# Patient Record
Sex: Female | Born: 1935 | Race: White | Hispanic: No | Smoking: Former smoker
Health system: Southern US, Community
[De-identification: ages and names within clinical notes are randomized; demographics above are authoritative.]

## PROBLEM LIST (undated history)

## (undated) DIAGNOSIS — S52602C Unspecified fracture of lower end of left ulna, initial encounter for open fracture type IIIA, IIIB, or IIIC: Secondary | ICD-10-CM

## (undated) DIAGNOSIS — F419 Anxiety disorder, unspecified: Secondary | ICD-10-CM

## (undated) DIAGNOSIS — S72002A Fracture of unspecified part of neck of left femur, initial encounter for closed fracture: Secondary | ICD-10-CM

## (undated) DIAGNOSIS — E039 Hypothyroidism, unspecified: Secondary | ICD-10-CM

## (undated) DIAGNOSIS — M1990 Unspecified osteoarthritis, unspecified site: Secondary | ICD-10-CM

## (undated) DIAGNOSIS — I10 Essential (primary) hypertension: Secondary | ICD-10-CM

## (undated) DIAGNOSIS — S52502C Unspecified fracture of the lower end of left radius, initial encounter for open fracture type IIIA, IIIB, or IIIC: Secondary | ICD-10-CM

## (undated) DIAGNOSIS — R413 Other amnesia: Secondary | ICD-10-CM

## (undated) DIAGNOSIS — K219 Gastro-esophageal reflux disease without esophagitis: Secondary | ICD-10-CM

## (undated) DIAGNOSIS — C801 Malignant (primary) neoplasm, unspecified: Secondary | ICD-10-CM

## (undated) DIAGNOSIS — E079 Disorder of thyroid, unspecified: Secondary | ICD-10-CM

## (undated) DIAGNOSIS — R569 Unspecified convulsions: Secondary | ICD-10-CM

## (undated) HISTORY — PX: EYE SURGERY: SHX253

## (undated) HISTORY — PX: COLONOSCOPY: SHX174

---

## 1998-09-17 ENCOUNTER — Other Ambulatory Visit: Admission: RE | Admit: 1998-09-17 | Discharge: 1998-09-17 | Payer: Self-pay | Admitting: Family Medicine

## 2004-09-23 ENCOUNTER — Ambulatory Visit: Payer: Self-pay | Admitting: Family Medicine

## 2004-10-06 ENCOUNTER — Ambulatory Visit: Payer: Self-pay | Admitting: Family Medicine

## 2005-01-13 ENCOUNTER — Ambulatory Visit: Payer: Self-pay | Admitting: Family Medicine

## 2005-02-24 ENCOUNTER — Ambulatory Visit: Payer: Self-pay | Admitting: Family Medicine

## 2005-03-31 ENCOUNTER — Ambulatory Visit: Payer: Self-pay | Admitting: Family Medicine

## 2005-08-13 ENCOUNTER — Ambulatory Visit: Payer: Self-pay | Admitting: Family Medicine

## 2005-08-19 ENCOUNTER — Ambulatory Visit: Payer: Self-pay | Admitting: Family Medicine

## 2005-12-15 ENCOUNTER — Ambulatory Visit: Payer: Self-pay | Admitting: Family Medicine

## 2005-12-23 ENCOUNTER — Ambulatory Visit: Payer: Self-pay | Admitting: Family Medicine

## 2018-02-07 DIAGNOSIS — R569 Unspecified convulsions: Secondary | ICD-10-CM

## 2018-02-07 HISTORY — DX: Unspecified convulsions: R56.9

## 2018-02-23 ENCOUNTER — Inpatient Hospital Stay (HOSPITAL_COMMUNITY)
Admission: EM | Admit: 2018-02-23 | Discharge: 2018-02-28 | DRG: 956 | Disposition: A | Discharge status: Skilled Nursing Facility | Payer: Medicare Other | Attending: Family Medicine | Admitting: Family Medicine

## 2018-02-23 ENCOUNTER — Inpatient Hospital Stay (HOSPITAL_COMMUNITY): Payer: Medicare Other

## 2018-02-23 ENCOUNTER — Emergency Department (HOSPITAL_COMMUNITY): Payer: Medicare Other

## 2018-02-23 ENCOUNTER — Encounter (HOSPITAL_COMMUNITY): Payer: Self-pay | Admitting: Neurology

## 2018-02-23 ENCOUNTER — Emergency Department (HOSPITAL_COMMUNITY): Payer: Medicare Other | Admitting: Anesthesiology

## 2018-02-23 ENCOUNTER — Encounter (HOSPITAL_COMMUNITY)
Admission: EM | Disposition: A | Discharge status: Skilled Nursing Facility | Payer: Self-pay | Source: Home / Self Care | Attending: Family Medicine

## 2018-02-23 DIAGNOSIS — S41012A Laceration without foreign body of left shoulder, initial encounter: Secondary | ICD-10-CM | POA: Diagnosis present

## 2018-02-23 DIAGNOSIS — W19XXXA Unspecified fall, initial encounter: Secondary | ICD-10-CM

## 2018-02-23 DIAGNOSIS — I10 Essential (primary) hypertension: Secondary | ICD-10-CM | POA: Diagnosis present

## 2018-02-23 DIAGNOSIS — R569 Unspecified convulsions: Secondary | ICD-10-CM | POA: Diagnosis present

## 2018-02-23 DIAGNOSIS — I452 Bifascicular block: Secondary | ICD-10-CM | POA: Diagnosis present

## 2018-02-23 DIAGNOSIS — I6782 Cerebral ischemia: Secondary | ICD-10-CM | POA: Diagnosis present

## 2018-02-23 DIAGNOSIS — S0083XA Contusion of other part of head, initial encounter: Secondary | ICD-10-CM | POA: Diagnosis present

## 2018-02-23 DIAGNOSIS — J9811 Atelectasis: Secondary | ICD-10-CM | POA: Diagnosis not present

## 2018-02-23 DIAGNOSIS — S0003XA Contusion of scalp, initial encounter: Secondary | ICD-10-CM | POA: Diagnosis present

## 2018-02-23 DIAGNOSIS — G3189 Other specified degenerative diseases of nervous system: Secondary | ICD-10-CM | POA: Diagnosis present

## 2018-02-23 DIAGNOSIS — S72002A Fracture of unspecified part of neck of left femur, initial encounter for closed fracture: Principal | ICD-10-CM

## 2018-02-23 DIAGNOSIS — S52602C Unspecified fracture of lower end of left ulna, initial encounter for open fracture type IIIA, IIIB, or IIIC: Secondary | ICD-10-CM | POA: Diagnosis present

## 2018-02-23 DIAGNOSIS — S52602F Unspecified fracture of lower end of left ulna, subsequent encounter for open fracture type IIIA, IIIB, or IIIC with routine healing: Secondary | ICD-10-CM

## 2018-02-23 DIAGNOSIS — E039 Hypothyroidism, unspecified: Secondary | ICD-10-CM | POA: Diagnosis present

## 2018-02-23 DIAGNOSIS — M25552 Pain in left hip: Secondary | ICD-10-CM

## 2018-02-23 DIAGNOSIS — F0390 Unspecified dementia without behavioral disturbance: Secondary | ICD-10-CM | POA: Diagnosis present

## 2018-02-23 DIAGNOSIS — W010XXA Fall on same level from slipping, tripping and stumbling without subsequent striking against object, initial encounter: Secondary | ICD-10-CM | POA: Diagnosis present

## 2018-02-23 DIAGNOSIS — D62 Acute posthemorrhagic anemia: Secondary | ICD-10-CM | POA: Diagnosis not present

## 2018-02-23 DIAGNOSIS — Z23 Encounter for immunization: Secondary | ICD-10-CM

## 2018-02-23 DIAGNOSIS — S62102B Fracture of unspecified carpal bone, left wrist, initial encounter for open fracture: Secondary | ICD-10-CM

## 2018-02-23 DIAGNOSIS — S52502C Unspecified fracture of the lower end of left radius, initial encounter for open fracture type IIIA, IIIB, or IIIC: Secondary | ICD-10-CM | POA: Diagnosis present

## 2018-02-23 DIAGNOSIS — E876 Hypokalemia: Secondary | ICD-10-CM | POA: Diagnosis present

## 2018-02-23 DIAGNOSIS — Z9889 Other specified postprocedural states: Secondary | ICD-10-CM

## 2018-02-23 DIAGNOSIS — S52502F Unspecified fracture of the lower end of left radius, subsequent encounter for open fracture type IIIA, IIIB, or IIIC with routine healing: Secondary | ICD-10-CM | POA: Diagnosis present

## 2018-02-23 DIAGNOSIS — Z79899 Other long term (current) drug therapy: Secondary | ICD-10-CM | POA: Diagnosis not present

## 2018-02-23 DIAGNOSIS — S7290XA Unspecified fracture of unspecified femur, initial encounter for closed fracture: Secondary | ICD-10-CM | POA: Diagnosis present

## 2018-02-23 DIAGNOSIS — K219 Gastro-esophageal reflux disease without esophagitis: Secondary | ICD-10-CM | POA: Diagnosis present

## 2018-02-23 DIAGNOSIS — D696 Thrombocytopenia, unspecified: Secondary | ICD-10-CM | POA: Diagnosis present

## 2018-02-23 DIAGNOSIS — Z8781 Personal history of (healed) traumatic fracture: Secondary | ICD-10-CM

## 2018-02-23 HISTORY — DX: Disorder of thyroid, unspecified: E07.9

## 2018-02-23 HISTORY — DX: Fracture of unspecified part of neck of left femur, initial encounter for closed fracture: S72.002A

## 2018-02-23 HISTORY — DX: Unspecified fracture of the lower end of left radius, initial encounter for open fracture type IIIA, IIIB, or IIIC: S52.502C

## 2018-02-23 HISTORY — DX: Unspecified fracture of lower end of left ulna, initial encounter for open fracture type IIIA, IIIB, or IIIC: S52.602C

## 2018-02-23 HISTORY — DX: Gastro-esophageal reflux disease without esophagitis: K21.9

## 2018-02-23 HISTORY — PX: EXTERNAL FIXATION ARM: SHX1552

## 2018-02-23 HISTORY — PX: HIP PINNING,CANNULATED: SHX1758

## 2018-02-23 HISTORY — DX: Essential (primary) hypertension: I10

## 2018-02-23 LAB — URINALYSIS, ROUTINE W REFLEX MICROSCOPIC
Bilirubin Urine: NEGATIVE
Glucose, UA: NEGATIVE mg/dL
KETONES UR: 5 mg/dL — AB
NITRITE: NEGATIVE
PROTEIN: NEGATIVE mg/dL
Specific Gravity, Urine: 1.009 (ref 1.005–1.030)
pH: 6 (ref 5.0–8.0)

## 2018-02-23 LAB — COMPREHENSIVE METABOLIC PANEL
ALBUMIN: 3.7 g/dL (ref 3.5–5.0)
ALT: 16 U/L (ref 14–54)
AST: 21 U/L (ref 15–41)
Alkaline Phosphatase: 88 U/L (ref 38–126)
Anion gap: 11 (ref 5–15)
BUN: 11 mg/dL (ref 6–20)
CHLORIDE: 109 mmol/L (ref 101–111)
CO2: 20 mmol/L — ABNORMAL LOW (ref 22–32)
Calcium: 8.3 mg/dL — ABNORMAL LOW (ref 8.9–10.3)
Creatinine, Ser: 0.7 mg/dL (ref 0.44–1.00)
GFR calc Af Amer: >60 mL/min (ref >60)
GFR calc non Af Amer: >60 mL/min (ref >60)
GLUCOSE: 144 mg/dL — AB (ref 65–99)
POTASSIUM: 2.9 mmol/L — AB (ref 3.5–5.1)
Sodium: 140 mmol/L (ref 135–145)
Total Bilirubin: 1 mg/dL (ref 0.3–1.2)
Total Protein: 6.1 g/dL — ABNORMAL LOW (ref 6.5–8.1)

## 2018-02-23 LAB — I-STAT CHEM 8, ED
BUN: 13 mg/dL (ref 6–20)
Calcium, Ion: 1.01 mmol/L — ABNORMAL LOW (ref 1.15–1.40)
Chloride: 106 mmol/L (ref 101–111)
Creatinine, Ser: 0.7 mg/dL (ref 0.44–1.00)
Glucose, Bld: 143 mg/dL — ABNORMAL HIGH (ref 65–99)
HEMATOCRIT: 41 % (ref 36.0–46.0)
HEMOGLOBIN: 13.9 g/dL (ref 12.0–15.0)
Potassium: 2.9 mmol/L — ABNORMAL LOW (ref 3.5–5.1)
SODIUM: 141 mmol/L (ref 135–145)
TCO2: 23 mmol/L (ref 22–32)

## 2018-02-23 LAB — CBC
HEMATOCRIT: 41.4 % (ref 36.0–46.0)
HEMOGLOBIN: 14.2 g/dL (ref 12.0–15.0)
MCH: 31.5 pg (ref 26.0–34.0)
MCHC: 34.3 g/dL (ref 30.0–36.0)
MCV: 91.8 fL (ref 78.0–100.0)
Platelets: 166 10*3/uL (ref 150–400)
RBC: 4.51 MIL/uL (ref 3.87–5.11)
RDW: 12.5 % (ref 11.5–15.5)
WBC: 11.5 10*3/uL — AB (ref 4.0–10.5)

## 2018-02-23 LAB — PROTIME-INR
INR: 1.03
Prothrombin Time: 13.4 seconds (ref 11.4–15.2)

## 2018-02-23 LAB — ETHANOL: Alcohol, Ethyl (B): <10 mg/dL (ref <10)

## 2018-02-23 LAB — I-STAT CG4 LACTIC ACID, ED: LACTIC ACID, VENOUS: 1.07 mmol/L (ref 0.5–1.9)

## 2018-02-23 SURGERY — EXTERNAL FIXATION, UPPER EXTREMITY
Anesthesia: General | Site: Wrist | Laterality: Left

## 2018-02-23 MED ORDER — CEFAZOLIN SODIUM-DEXTROSE 2-4 GM/100ML-% IV SOLN
2.0000 g | Freq: Once | INTRAVENOUS | Status: AC
  Administered 2018-02-23: 2 g via INTRAVENOUS
  Filled 2018-02-23: qty 100

## 2018-02-23 MED ORDER — ACETAMINOPHEN 325 MG PO TABS: 650.0000 mg | ORAL_TABLET | Freq: Four times a day (QID) | ORAL | 1 refills | Status: AC | PRN

## 2018-02-23 MED ORDER — FENTANYL CITRATE (PF) 100 MCG/2ML IJ SOLN
INTRAMUSCULAR | Status: AC
  Administered 2018-02-23: 50 ug via INTRAVENOUS
  Filled 2018-02-23: qty 2

## 2018-02-23 MED ORDER — CHLORHEXIDINE GLUCONATE 4 % EX LIQD: 60.0000 mL | Freq: Once | CUTANEOUS | Status: DC

## 2018-02-23 MED ORDER — FENTANYL CITRATE (PF) 100 MCG/2ML IJ SOLN
INTRAMUSCULAR | Status: DC | PRN
  Administered 2018-02-23: 25 ug via INTRAVENOUS

## 2018-02-23 MED ORDER — PROMETHAZINE HCL 25 MG/ML IJ SOLN: 6.2500 mg | INTRAMUSCULAR | Status: DC | PRN

## 2018-02-23 MED ORDER — LACTATED RINGERS IV SOLN
INTRAVENOUS | Status: DC | PRN
  Administered 2018-02-23: 19:00:00 via INTRAVENOUS

## 2018-02-23 MED ORDER — ENOXAPARIN SODIUM 30 MG/0.3ML ~~LOC~~ SOLN: 30.0000 mg | SUBCUTANEOUS | 0 refills | Status: AC

## 2018-02-23 MED ORDER — ONDANSETRON HCL 4 MG/2ML IJ SOLN
INTRAMUSCULAR | Status: DC | PRN
  Administered 2018-02-23: 4 mg via INTRAVENOUS

## 2018-02-23 MED ORDER — LIDOCAINE 2% (20 MG/ML) 5 ML SYRINGE
INTRAMUSCULAR | Status: AC
  Filled 2018-02-23: qty 5

## 2018-02-23 MED ORDER — FENTANYL CITRATE (PF) 100 MCG/2ML IJ SOLN: 25.0000 ug | INTRAMUSCULAR | Status: DC | PRN

## 2018-02-23 MED ORDER — ROCURONIUM BROMIDE 100 MG/10ML IV SOLN
INTRAVENOUS | Status: DC | PRN
  Administered 2018-02-23: 50 mg via INTRAVENOUS

## 2018-02-23 MED ORDER — SODIUM CHLORIDE 0.9 % IR SOLN
Status: DC | PRN
  Administered 2018-02-23: 9000 mL

## 2018-02-23 MED ORDER — ONDANSETRON HCL 4 MG/2ML IJ SOLN
4.0000 mg | Freq: Once | INTRAMUSCULAR | Status: DC
  Filled 2018-02-23: qty 2

## 2018-02-23 MED ORDER — BUPIVACAINE HCL (PF) 0.25 % IJ SOLN
INTRAMUSCULAR | Status: AC
  Filled 2018-02-23: qty 30

## 2018-02-23 MED ORDER — SUGAMMADEX SODIUM 200 MG/2ML IV SOLN
INTRAVENOUS | Status: DC | PRN
  Administered 2018-02-23: 200 mg via INTRAVENOUS

## 2018-02-23 MED ORDER — FENTANYL CITRATE (PF) 100 MCG/2ML IJ SOLN
50.0000 ug | Freq: Once | INTRAMUSCULAR | Status: AC
  Administered 2018-02-23: 50 ug via INTRAVENOUS

## 2018-02-23 MED ORDER — ROPIVACAINE HCL 7.5 MG/ML IJ SOLN
INTRAMUSCULAR | Status: DC | PRN
  Administered 2018-02-23: 30 mL via PERINEURAL

## 2018-02-23 MED ORDER — LIDOCAINE HCL (CARDIAC) PF 100 MG/5ML IV SOSY
PREFILLED_SYRINGE | INTRAVENOUS | Status: DC | PRN
  Administered 2018-02-23: 20 mg via INTRAVENOUS

## 2018-02-23 MED ORDER — FENTANYL CITRATE (PF) 100 MCG/2ML IJ SOLN
50.0000 ug | INTRAMUSCULAR | Status: DC | PRN
  Administered 2018-02-23 (×2): 50 ug via INTRAVENOUS
  Filled 2018-02-23: qty 2

## 2018-02-23 MED ORDER — PHENYLEPHRINE HCL 10 MG/ML IJ SOLN
INTRAMUSCULAR | Status: DC | PRN
  Administered 2018-02-23 (×5): 80 ug via INTRAVENOUS

## 2018-02-23 MED ORDER — BUPIVACAINE HCL (PF) 0.25 % IJ SOLN
INTRAMUSCULAR | Status: DC | PRN
  Administered 2018-02-23: 10 mL

## 2018-02-23 MED ORDER — CEFAZOLIN SODIUM-DEXTROSE 2-4 GM/100ML-% IV SOLN
2.0000 g | INTRAVENOUS | Status: AC
  Administered 2018-02-23: 2 g via INTRAVENOUS

## 2018-02-23 MED ORDER — PROPOFOL 10 MG/ML IV BOLUS
INTRAVENOUS | Status: DC | PRN
  Administered 2018-02-23: 80 mg via INTRAVENOUS

## 2018-02-23 MED ORDER — LACTATED RINGERS IV SOLN: INTRAVENOUS | Status: DC

## 2018-02-23 MED ORDER — TETANUS-DIPHTH-ACELL PERTUSSIS 5-2.5-18.5 LF-MCG/0.5 IM SUSP
0.5000 mL | Freq: Once | INTRAMUSCULAR | Status: AC
Start: 2018-02-23 — End: 2018-02-23
  Administered 2018-02-23: 0.5 mL via INTRAMUSCULAR
  Filled 2018-02-23: qty 0.5

## 2018-02-23 MED ORDER — ONDANSETRON HCL 4 MG/2ML IJ SOLN
4.0000 mg | Freq: Once | INTRAMUSCULAR | Status: AC
  Administered 2018-02-23: 4 mg via INTRAVENOUS

## 2018-02-23 MED ORDER — SENNA-DOCUSATE SODIUM 8.6-50 MG PO TABS: 2.0000 | ORAL_TABLET | Freq: Every day | ORAL | 1 refills | Status: AC

## 2018-02-23 MED ORDER — FENTANYL CITRATE (PF) 250 MCG/5ML IJ SOLN
INTRAMUSCULAR | Status: AC
  Filled 2018-02-23: qty 5

## 2018-02-23 MED ORDER — SUCCINYLCHOLINE CHLORIDE 20 MG/ML IJ SOLN
INTRAMUSCULAR | Status: DC | PRN
  Administered 2018-02-23: 60 mg via INTRAVENOUS

## 2018-02-23 MED ORDER — MEPERIDINE HCL 50 MG/ML IJ SOLN: 6.2500 mg | INTRAMUSCULAR | Status: DC | PRN

## 2018-02-23 MED ORDER — FENTANYL CITRATE (PF) 100 MCG/2ML IJ SOLN
INTRAMUSCULAR | Status: AC
  Filled 2018-02-23: qty 2

## 2018-02-23 MED ORDER — CLONIDINE HCL (ANALGESIA) 100 MCG/ML EP SOLN
EPIDURAL | Status: DC | PRN
  Administered 2018-02-23: 50 ug

## 2018-02-23 MED ORDER — 0.9 % SODIUM CHLORIDE (POUR BTL) OPTIME
TOPICAL | Status: DC | PRN
  Administered 2018-02-23 (×2): 1000 mL

## 2018-02-23 MED ORDER — POTASSIUM CHLORIDE 10 MEQ/100ML IV SOLN
10.0000 meq | INTRAVENOUS | Status: DC
  Administered 2018-02-23: 10 meq via INTRAVENOUS
  Filled 2018-02-23 (×2): qty 100

## 2018-02-23 MED ORDER — ONDANSETRON HCL 4 MG/2ML IJ SOLN
INTRAMUSCULAR | Status: AC
  Filled 2018-02-23: qty 2

## 2018-02-23 MED ORDER — POVIDONE-IODINE 10 % EX SWAB: 2.0000 "application " | Freq: Once | CUTANEOUS | Status: DC

## 2018-02-23 MED ORDER — PHENYLEPHRINE HCL 10 MG/ML IJ SOLN
INTRAVENOUS | Status: DC | PRN
  Administered 2018-02-23: 50 ug/min via INTRAVENOUS

## 2018-02-23 MED ORDER — PROPOFOL 10 MG/ML IV BOLUS
INTRAVENOUS | Status: AC
  Filled 2018-02-23: qty 20

## 2018-02-23 SURGICAL SUPPLY — 83 items
BANDAGE ACE 3X5.8 VEL STRL LF (GAUZE/BANDAGES/DRESSINGS) ×5 IMPLANT
BANDAGE ACE 4X5 VEL STRL LF (GAUZE/BANDAGES/DRESSINGS) ×5 IMPLANT
BANDAGE ELASTIC 3 VELCRO ST LF (GAUZE/BANDAGES/DRESSINGS) ×5 IMPLANT
BAR CARBON EXFX 6X250 (EXFIX) ×10 IMPLANT
BENZOIN TINCTURE PRP APPL 2/3 (GAUZE/BANDAGES/DRESSINGS) ×5 IMPLANT
BIT DRILL CANNULATED (DRILL) ×3 IMPLANT
BLADE CLIPPER SURG (BLADE) IMPLANT
BNDG COHESIVE 4X5 TAN STRL (GAUZE/BANDAGES/DRESSINGS) ×5 IMPLANT
BNDG COHESIVE 6X5 TAN STRL LF (GAUZE/BANDAGES/DRESSINGS) IMPLANT
BNDG ESMARK 4X9 LF (GAUZE/BANDAGES/DRESSINGS) ×5 IMPLANT
BNDG GAUZE ELAST 4 BULKY (GAUZE/BANDAGES/DRESSINGS) ×5 IMPLANT
BOOTCOVER CLEANROOM LRG (PROTECTIVE WEAR) ×10 IMPLANT
CLOSURE STERI-STRIP 1/2X4 (GAUZE/BANDAGES/DRESSINGS) ×1
CLSR STERI-STRIP ANTIMIC 1/2X4 (GAUZE/BANDAGES/DRESSINGS) ×4 IMPLANT
CORDS BIPOLAR (ELECTRODE) ×5 IMPLANT
COVER PERINEAL POST (MISCELLANEOUS) ×5 IMPLANT
COVER SURGICAL LIGHT HANDLE (MISCELLANEOUS) ×10 IMPLANT
CUFF TOURNIQUET SINGLE 18IN (TOURNIQUET CUFF) ×5 IMPLANT
DECANTER SPIKE VIAL GLASS SM (MISCELLANEOUS) ×5 IMPLANT
DRAPE INCISE IOBAN 66X45 STRL (DRAPES) IMPLANT
DRAPE OEC MINIVIEW 54X84 (DRAPES) ×5 IMPLANT
DRAPE STERI IOBAN 125X83 (DRAPES) ×10 IMPLANT
DRAPE U-SHAPE 47X51 STRL (DRAPES) ×5 IMPLANT
DRILL CANNULATED (DRILL) ×5
DRSG ADAPTIC 3X8 NADH LF (GAUZE/BANDAGES/DRESSINGS) ×5 IMPLANT
DRSG MEPILEX BORDER 4X4 (GAUZE/BANDAGES/DRESSINGS) ×5 IMPLANT
DRSG PAD ABDOMINAL 8X10 ST (GAUZE/BANDAGES/DRESSINGS) IMPLANT
DURAPREP 26ML APPLICATOR (WOUND CARE) ×5 IMPLANT
ELECT REM PT RETURN 9FT ADLT (ELECTROSURGICAL) ×10
ELECTRODE REM PT RTRN 9FT ADLT (ELECTROSURGICAL) ×6 IMPLANT
GAUZE SPONGE 4X4 12PLY STRL (GAUZE/BANDAGES/DRESSINGS) ×10 IMPLANT
GAUZE XEROFORM 1X8 LF (GAUZE/BANDAGES/DRESSINGS) ×5 IMPLANT
GLOVE BIOGEL PI IND STRL 8 (GLOVE) ×3 IMPLANT
GLOVE BIOGEL PI INDICATOR 8 (GLOVE) ×2
GLOVE BIOGEL PI ORTHO PRO 7.5 (GLOVE) ×4
GLOVE BIOGEL PI ORTHO PRO SZ8 (GLOVE) ×8
GLOVE ORTHO TXT STRL SZ7.5 (GLOVE) ×20 IMPLANT
GLOVE PI ORTHO PRO STRL 7.5 (GLOVE) ×6 IMPLANT
GLOVE PI ORTHO PRO STRL SZ8 (GLOVE) ×12 IMPLANT
GLOVE SURG ORTHO 8.0 STRL STRW (GLOVE) ×30 IMPLANT
GOWN STRL REUS W/ TWL LRG LVL3 (GOWN DISPOSABLE) IMPLANT
GOWN STRL REUS W/ TWL XL LVL3 (GOWN DISPOSABLE) ×3 IMPLANT
GOWN STRL REUS W/TWL 2XL LVL3 (GOWN DISPOSABLE) IMPLANT
GOWN STRL REUS W/TWL LRG LVL3 (GOWN DISPOSABLE)
GOWN STRL REUS W/TWL XL LVL3 (GOWN DISPOSABLE) ×2
KIT BASIN OR (CUSTOM PROCEDURE TRAY) ×10 IMPLANT
KIT TURNOVER KIT B (KITS) ×10 IMPLANT
LINER BOOT UNIVERSAL DISP (MISCELLANEOUS) ×5 IMPLANT
LOOP VESSEL MAXI BLUE (MISCELLANEOUS) IMPLANT
MANIFOLD NEPTUNE II (INSTRUMENTS) ×10 IMPLANT
NEEDLE 22X1 1/2 (OR ONLY) (NEEDLE) ×5 IMPLANT
NS IRRIG 1000ML POUR BTL (IV SOLUTION) ×15 IMPLANT
PACK GENERAL/GYN (CUSTOM PROCEDURE TRAY) ×5 IMPLANT
PACK ORTHO EXTREMITY (CUSTOM PROCEDURE TRAY) ×10 IMPLANT
PAD ABD 8X10 STRL (GAUZE/BANDAGES/DRESSINGS) ×10 IMPLANT
PAD ARMBOARD 7.5X6 YLW CONV (MISCELLANEOUS) ×15 IMPLANT
PAD CAST 4YDX4 CTTN HI CHSV (CAST SUPPLIES) ×3 IMPLANT
PADDING CAST COTTON 4X4 STRL (CAST SUPPLIES) ×2
PIN 4X100X20MM (EXFIX) ×10 IMPLANT
PIN CLAMP 2BAR 33MM EXFIX (EXFIX) ×10 IMPLANT
PIN HALF 3X120X20 SMALL (PIN) ×10 IMPLANT
PIN THD TIP GUIDE 3.2X12 (PIN) ×15 IMPLANT
SCREW CANN RVR CUT FLUT 90X16X (Screw) ×3 IMPLANT
SCREW CANN RVRS CUT FLT 85X16 (Screw) ×6 IMPLANT
SCREW CANNULATED 6.5X85MM (Screw) ×4 IMPLANT
SCREW CANNULATED 6.5X90 (Screw) ×2 IMPLANT
STAPLER VISISTAT (STAPLE) ×5 IMPLANT
SUT ETHILON 3 0 PS 1 (SUTURE) ×10 IMPLANT
SUT VIC AB 2-0 FS1 27 (SUTURE) ×5 IMPLANT
SUT VIC AB 2-0 SH 27 (SUTURE) ×2
SUT VIC AB 2-0 SH 27XBRD (SUTURE) ×3 IMPLANT
SUT VIC AB 3-0 FS2 27 (SUTURE) IMPLANT
SUT VIC AB 3-0 SH 27 (SUTURE) ×2
SUT VIC AB 3-0 SH 27X BRD (SUTURE) ×3 IMPLANT
SYR CONTROL 10ML LL (SYRINGE) ×10 IMPLANT
TOWEL GREEN STERILE (TOWEL DISPOSABLE) ×5 IMPLANT
TOWEL OR 17X24 6PK STRL BLUE (TOWEL DISPOSABLE) ×5 IMPLANT
TOWEL OR 17X26 10 PK STRL BLUE (TOWEL DISPOSABLE) ×10 IMPLANT
TRAY FOLEY W/BAG SLVR 16FR (SET/KITS/TRAYS/PACK) ×2
TRAY FOLEY W/BAG SLVR 16FR ST (SET/KITS/TRAYS/PACK) ×3 IMPLANT
TUBE CONNECTING 12'X1/4 (SUCTIONS) ×1
TUBE CONNECTING 12X1/4 (SUCTIONS) ×4 IMPLANT
WATER STERILE IRR 1000ML POUR (IV SOLUTION) ×5 IMPLANT

## 2018-02-23 NOTE — Anesthesia Postprocedure Evaluation (Signed)
Anesthesia Post Note  Patient: Rebecca Petty  Procedure(s) Performed: EXTERNAL FIXATION Left ARM, irrigation and debridement bone, complex wound closure (Left Wrist) CANNULATED LEFT HIP PINNING (Left Hip)     Patient location during evaluation: PACU Anesthesia Type: General Level of consciousness: awake and alert Pain management: pain level controlled Vital Signs Assessment: post-procedure vital signs reviewed and stable Respiratory status: spontaneous breathing, nonlabored ventilation, respiratory function stable and patient connected to nasal cannula oxygen Cardiovascular status: blood pressure returned to baseline and stable Postop Assessment: no apparent nausea or vomiting Anesthetic complications: no    Last Vitals:  Vitals:   02/23/18 2245 02/23/18 2300  BP: 123/65 (!) 105/57  Pulse: 66 74  Resp: 18 17  Temp:    SpO2: 100% 100%    Last Pain:  Vitals:   02/23/18 2215  TempSrc:   PainSc: 0-No pain                 Tiajuana Amass

## 2018-02-23 NOTE — ED Notes (Signed)
Report given to OR.

## 2018-02-23 NOTE — ED Provider Notes (Signed)
4:25 PM Handoff from Magazine PA-C at shift change.   Pt with mechanical fall today at [employer]. She hit her head, sustained open L wrist injury, skin tear to the left shoulder and right forearm, and c/o L hip pain.   Wrist was placed in sugartong and seen by previous provider and Dr. Lita Mains.  Ancef given. Imaging orders placed.  Patient had intact sensation, difficulty moving her fourth and fifth digits, 2+ radial pulse and normal capillary refill per previous provider.  I saw patient, she can feel me touching exposed fingertips and they have normal capillary refill.  Initial imaging of pelvis negative. Will obtain dedicated hip films.   4:40 PM spoke with Dr. Mardelle Matte who is aware of patient.  Imaging has been performed however there was a problem with PACS that was preventing films from being uploaded. Requests patient remain NPO.  Plans for surgery tonight.  5:31 PM imaging available and reviewed.  Family and patient updated.   Potassium is hanging. Plan OR tonight.   Results for orders placed or performed during the hospital encounter of 02/23/18  Comprehensive metabolic panel  Result Value Ref Range   Sodium 140 135 - 145 mmol/L   Potassium 2.9 (L) 3.5 - 5.1 mmol/L   Chloride 109 101 - 111 mmol/L   CO2 20 (L) 22 - 32 mmol/L   Glucose, Bld 144 (H) 65 - 99 mg/dL   BUN 11 6 - 20 mg/dL   Creatinine, Ser 0.70 0.44 - 1.00 mg/dL   Calcium 8.3 (L) 8.9 - 10.3 mg/dL   Total Protein 6.1 (L) 6.5 - 8.1 g/dL   Albumin 3.7 3.5 - 5.0 g/dL   AST 21 15 - 41 U/L   ALT 16 14 - 54 U/L   Alkaline Phosphatase 88 38 - 126 U/L   Total Bilirubin 1.0 0.3 - 1.2 mg/dL   GFR calc non Af Amer >60 >60 mL/min   GFR calc Af Amer >60 >60 mL/min   Anion gap 11 5 - 15  CBC  Result Value Ref Range   WBC 11.5 (H) 4.0 - 10.5 K/uL   RBC 4.51 3.87 - 5.11 MIL/uL   Hemoglobin 14.2 12.0 - 15.0 g/dL   HCT 41.4 36.0 - 46.0 %   MCV 91.8 78.0 - 100.0 fL   MCH 31.5 26.0 - 34.0 pg   MCHC 34.3 30.0 - 36.0 g/dL   RDW 12.5  11.5 - 15.5 %   Platelets 166 150 - 400 K/uL  Ethanol  Result Value Ref Range   Alcohol, Ethyl (B) <10 <10 mg/dL  Urinalysis, Routine w reflex microscopic  Result Value Ref Range   Color, Urine YELLOW YELLOW   APPearance HAZY (A) CLEAR   Specific Gravity, Urine 1.009 1.005 - 1.030   pH 6.0 5.0 - 8.0   Glucose, UA NEGATIVE NEGATIVE mg/dL   Hgb urine dipstick MODERATE (A) NEGATIVE   Bilirubin Urine NEGATIVE NEGATIVE   Ketones, ur 5 (A) NEGATIVE mg/dL   Protein, ur NEGATIVE NEGATIVE mg/dL   Nitrite NEGATIVE NEGATIVE   Leukocytes, UA MODERATE (A) NEGATIVE   RBC / HPF 6-30 0 - 5 RBC/hpf   WBC, UA 6-30 0 - 5 WBC/hpf   Bacteria, UA RARE (A) NONE SEEN   Squamous Epithelial / LPF 6-30 (A) NONE SEEN   Non Squamous Epithelial 0-5 (A) NONE SEEN  Protime-INR  Result Value Ref Range   Prothrombin Time 13.4 11.4 - 15.2 seconds   INR 1.03   I-Stat Chem 8, ED  Result Value Ref Range   Sodium 141 135 - 145 mmol/L   Potassium 2.9 (L) 3.5 - 5.1 mmol/L   Chloride 106 101 - 111 mmol/L   BUN 13 6 - 20 mg/dL   Creatinine, Ser 0.70 0.44 - 1.00 mg/dL   Glucose, Bld 143 (H) 65 - 99 mg/dL   Calcium, Ion 1.01 (L) 1.15 - 1.40 mmol/L   TCO2 23 22 - 32 mmol/L   Hemoglobin 13.9 12.0 - 15.0 g/dL   HCT 41.0 36.0 - 46.0 %  I-Stat CG4 Lactic Acid, ED  Result Value Ref Range   Lactic Acid, Venous 1.07 0.5 - 1.9 mmol/L   Dg Elbow 2 Views Left  Result Date: 02/23/2018 CLINICAL DATA:  Fall EXAM: LEFT ELBOW - 2 VIEW COMPARISON:  None. FINDINGS: There is no evidence of fracture, dislocation, or joint effusion. There is no evidence of arthropathy or other focal bone abnormality. Soft tissues are unremarkable. IMPRESSION: Negative. Electronically Signed   By: Franchot Gallo M.D.   On: 02/23/2018 17:27   Dg Wrist 2 Views Left  Result Date: 02/23/2018 CLINICAL DATA:  Fall EXAM: LEFT WRIST - 2 VIEW COMPARISON:  None. FINDINGS: Image quality degraded by plaster splint. Comminuted fracture distal radius with  radiocarpal dislocation. Suboptimal anatomical evaluation, consider CT for further evaluation. Questionable fracture distal ulna. IMPRESSION: Fracture dislocation of the wrist. Recommend CT for better anatomical evaluation. Electronically Signed   By: Franchot Gallo M.D.   On: 02/23/2018 17:26   Ct Head Wo Contrast  Result Date: 02/23/2018 CLINICAL DATA:  Fall, head injury, left frontal scalp hematoma EXAM: CT HEAD WITHOUT CONTRAST TECHNIQUE: Contiguous axial images were obtained from the base of the skull through the vertex without intravenous contrast. COMPARISON:  02/08/2017 FINDINGS: Brain: Age related brain atrophy and extensive chronic white matter microvascular ischemic changes throughout both cerebral hemispheres. No acute intracranial hemorrhage, mass lesion, new infarction, midline shift, herniation, hydrocephalus, or extra-axial fluid collection. Cisterns are patent. Cerebellar atrophy as well. Vascular: Intracranial atherosclerosis noted.  No hyperdense vessel. Skull: Left frontal scalp acute soft tissue hematoma. No underlying skull fracture. Sinuses/Orbits: Sinuses and mastoids are clear.  Symmetric orbits. Other: None. IMPRESSION: Atrophy and extensive chronic white matter microvascular ischemic changes. Acute left anterior frontal scalp hematoma without intracranial hemorrhage or skull fracture. Electronically Signed   By: Jerilynn Mages.  Shick M.D.   On: 02/23/2018 17:00   Dg Pelvis Portable  Result Date: 02/23/2018 CLINICAL DATA:  Fall today EXAM: PORTABLE PELVIS 1-2 VIEWS COMPARISON:  02/08/2017 FINDINGS: There is no evidence of pelvic fracture or diastasis. No pelvic bone lesions are seen. Atherosclerotic calcification. IMPRESSION: Negative. Electronically Signed   By: Franchot Gallo M.D.   On: 02/23/2018 14:23   Dg Chest Port 1 View  Result Date: 02/23/2018 CLINICAL DATA:  Fall today EXAM: PORTABLE CHEST 1 VIEW COMPARISON:  06/11/2017 FINDINGS: Heart size within normal limits. Negative for heart  failure. Atherosclerotic aortic arch. Lungs are clear. Apical scarring left greater than right IMPRESSION: No active disease. Electronically Signed   By: Franchot Gallo M.D.   On: 02/23/2018 14:22   Dg Shoulder Left Port  Result Date: 02/23/2018 CLINICAL DATA:  Fall EXAM: LEFT SHOULDER - 1 VIEW COMPARISON:  None. FINDINGS: There is no evidence of fracture or dislocation. There is no evidence of arthropathy or other focal bone abnormality. Soft tissues are unremarkable. IMPRESSION: Negative. Electronically Signed   By: Franchot Gallo M.D.   On: 02/23/2018 17:24   Dg Hip Unilat W Or Wo  Pelvis 2-3 Views Left  Result Date: 02/23/2018 CLINICAL DATA:  Left hip pain and stiffness EXAM: DG HIP (WITH OR WITHOUT PELVIS) 2-3V LEFT COMPARISON:  02/08/2017 FINDINGS: There is cortical irregularity of the proximal left hip surgical neck with mild foreshortening suspicious for a impacted subcapital femoral neck fracture. Bones are osteopenic. Visualized left hemipelvis intact. Peripheral atherosclerosis noted. IMPRESSION: Findings suspicious for a subtle proximal left hip subcapital femoral neck impacted fracture. Osteopenia Atherosclerosis Electronically Signed   By: Jerilynn Mages.  Shick M.D.   On: 02/23/2018 17:44   5:58 PM Patient has been taken to OR. I followed up on hip films. Possible subtle fracture. I called and discussed with Dr. Mardelle Matte who will take care of this.    Carlisle Cater, PA-C 02/23/18 1759    Sherwood Gambler, MD 02/23/18 [phone]

## 2018-02-23 NOTE — ED Notes (Signed)
Paged ortho tech to evaluate left wrist fracture to determine if splint may be available for stabilization during imaging.

## 2018-02-23 NOTE — Anesthesia Procedure Notes (Signed)
Procedure Name: Intubation Date/Time: 02/23/2018 6:52 PM Performed by: Purvis Kilts, CRNA Pre-anesthesia Checklist: Patient identified, Emergency Drugs available, Suction available, Patient being monitored and Timeout performed Patient Re-evaluated:Patient Re-evaluated prior to induction Oxygen Delivery Method: Circle system utilized Preoxygenation: Pre-oxygenation with 100% oxygen Induction Type: IV induction Ventilation: Mask ventilation without difficulty Laryngoscope Size: Mac and 3 Grade View: Grade I Tube type: Oral Tube size: 7.5 mm Number of attempts: 1 Airway Equipment and Method: Stylet Placement Confirmation: ETT inserted through vocal cords under direct vision,  positive ETCO2 and breath sounds checked- equal and bilateral Secured at: 21 cm Tube secured with: Tape Dental Injury: Teeth and Oropharynx as per pre-operative assessment

## 2018-02-23 NOTE — Discharge Instructions (Signed)
Diet: As you were doing prior to hospitalization   Shower:  May shower but keep the wounds dry, use an occlusive plastic wrap, NO SOAKING IN TUB.  If the bandage gets wet, change with a clean dry gauze.  If you have a splint on, leave the splint in place and keep the splint dry with a plastic bag.  Dressing:  You may change your dressing 3-5 days after surgery, unless you have a splint.  If you have a splint, then just leave the splint in place and we will change your bandages during your first follow-up appointment.    If you had hand or foot surgery, we will plan to remove your stitches in about 2 weeks in the office.  For all other surgeries, there are sticky tapes (steri-strips) on your wounds and all the stitches are absorbable.  Leave the steri-strips in place when changing your dressings, they will peel off with time, usually 2-3 weeks.  Activity:  Increase activity slowly as tolerated, but follow the weight bearing instructions below.  The rules on driving is that you can not be taking narcotics while you drive, and you must feel in control of the vehicle.    Weight Bearing:   Weightbearing as tolerated both lower extremities, nonweightbearing left hand and wrist, weightbearing as tolerated through the left elbow using a platform walker..    To prevent constipation: you may use a stool softener such as -  Colace (over the counter) 100 mg by mouth twice a day  Drink plenty of fluids (prune juice may be helpful) and high fiber foods Miralax (over the counter) for constipation as needed.    Itching:  If you experience itching with your medications, try taking only a single pain pill, or even half a pain pill at a time.  You may take up to 10 pain pills per day, and you can also use benadryl over the counter for itching or also to help with sleep.   Precautions:  If you experience chest pain or shortness of breath - call 911 immediately for transfer to the hospital emergency  department!!  If you develop a fever greater that 101 F, purulent drainage from wound, increased redness or drainage from wound, or calf pain -- Call the office at [phone]                                                Follow- Up Appointment:  Please call for an appointment to be seen in 2 weeks [place] - [phone]

## 2018-02-23 NOTE — Transfer of Care (Signed)
Immediate Anesthesia Transfer of Care Note  Patient: Rebecca Petty  Procedure(s) Performed: EXTERNAL FIXATION Left ARM, irrigation and debridement bone, complex wound closure (Left Wrist) CANNULATED LEFT HIP PINNING (Left Hip)  Patient Location: PACU  Anesthesia Type:General  Level of Consciousness: awake  Airway & Oxygen Therapy: Patient Spontanous Breathing  Post-op Assessment: Report given to RN and Post -op Vital signs reviewed and stable  Post vital signs: Reviewed and stable  Last Vitals:  Vitals Value Taken Time  BP 106/96 02/23/2018 10:28 PM  Temp    Pulse 70 02/23/2018 10:41 PM  Resp 21 02/23/2018 10:41 PM  SpO2 100 % 02/23/2018 10:41 PM  Vitals shown include unvalidated device data.  Last Pain:  Vitals:   02/23/18 2215  TempSrc:   PainSc: 0-No pain         Complications: No apparent anesthesia complications

## 2018-02-23 NOTE — ED Triage Notes (Signed)
Per ems- pt had just left the doctor's after a well visit, and was fasting for labs. She was walking into [employer] and tripped on the curb. She fell on her left side with + LOC for 1 min. She landed face down, twisted her left wrist. Reports open radial head fracture left, sensation intact. Swelling to left shoulder, left knee. BP 190/110, HR 86, 98% RA, 18 RR. Given 100 mcg fentanyl, oxygen dropped to 91%. Given 750 NS.

## 2018-02-23 NOTE — Progress Notes (Signed)
Orthopedic Tech Progress Note Patient Details:  Rebecca Petty [birth year] [id number]  Ortho Devices Type of Ortho Device: Ace wrap, Sugartong splint Ortho Device/Splint Location: lue Ortho Device/Splint Interventions: Application   Post Interventions Patient Tolerated: Well Instructions Provided: Care of device   Hildred Priest 02/23/2018, 2:49 PM

## 2018-02-23 NOTE — ED Provider Notes (Signed)
[facility] EMERGENCY DEPARTMENT Provider Note   CSN: [id number] Arrival date & time: 02/23/18  1306     History   Chief Complaint Chief Complaint  Patient presents with  . Fall    HPI Rebecca Petty is a 84 y.o. female.  With a past medical history of reflux hypertension and thyroid disease.  The patient presents via EMS after fall.  Patient was on her way and I have after fasting all morning for lab work at her PCP she had a mechanical fall falling onto her left side.  Patient suffered an open fracture to the left wrist.  She is left-hand dominant.  She also suffered injury to the left shoulder.  She is complaining of elbow pain.  Large hematoma to the left scalp.  Patient did lose consciousness during the event.  She is denying neck pain.  Paresthesia.  She is complaining of some pain in her left hip but denies any numbness or tingling in her toes.  HPI  Past Medical History:  Diagnosis Date  . GERD (gastroesophageal reflux disease)   . Hypertension   . Thyroid disease     There are no active problems to display for this patient.   History reviewed. No pertinent surgical history.   OB History   None      Home Medications    Prior to Admission medications   Not on File    Family History No family history on file.  Social History Social History   Tobacco Use  . Smoking status: Not on file  Substance Use Topics  . Alcohol use: Not on file  . Drug use: Not on file     Allergies   Patient has no known allergies.   Review of Systems Review of Systems  .Ten systems reviewed and are negative for acute change, except as noted in the HPI.   Physical Exam Updated Vital Signs Ht 5\' 5"  (1.651 m)   Wt 54.4 kg (120 lb)   BMI 19.97 kg/m   Physical Exam  Constitutional: She is oriented to person, place, and time. She appears well-developed and well-nourished. No distress.  HENT:  Head: Normocephalic.  Large hematoma over the left  forehead  Eyes: Conjunctivae are normal. No scleral icterus.  Neck: Normal range of motion.  Cardiovascular: Normal rate, regular rhythm and normal heart sounds. Exam reveals no gallop and no friction rub.  No murmur heard. Pulmonary/Chest: Effort normal and breath sounds normal. No respiratory distress.  Abdominal: Soft. Bowel sounds are normal. She exhibits no distension and no mass. There is no tenderness. There is no guarding.  Musculoskeletal:  Unable to range the left shoulder or elbow.  There is a obvious open fracture involving the left wrist with protrusion of the left distal ulna.  Bleeding is controlled. Unable to wiggle digits 4/5. Normal sensation and cap refill  Abrasion to the left knee without significant pain with range of motion.  Able to flex and extend the hip however complains of pain in the pelvic region.  Worse on the right.  Neurological: She is alert and oriented to person, place, and time.  Skin: Skin is warm and dry. She is not diaphoretic.  Large skin tear over the left shoulder, skin tear to the left knee  Psychiatric: Her behavior is normal.  Nursing note and vitals reviewed.    ED Treatments / Results  Labs (all labs ordered are listed, but only abnormal results are displayed) Labs Reviewed - No  data to display  EKG None  Radiology No results found.  Procedures Procedures (including critical care time)  Medications Ordered in ED Medications  fentaNYL (SUBLIMAZE) 100 MCG/2ML injection (has no administration in time range)  ondansetron (ZOFRAN) 4 MG/2ML injection (has no administration in time range)  fentaNYL (SUBLIMAZE) injection 50 mcg (50 mcg Intravenous Given 02/23/18 1345)  ondansetron (ZOFRAN) injection 4 mg (4 mg Intravenous Given 02/23/18 1342)     Initial Impression / Assessment and Plan / ED Course  I have reviewed the triage vital signs and the nursing notes.  Pertinent labs & imaging results that were available during my care of the  patient were reviewed by me and considered in my medical decision making (see chart for details).  Clinical Course as of Feb 23 1626  Wed Feb 23, 2018  1514 Potassium is low  Potassium(!): 2.9 [AH]    Clinical Course User Index [AH] Margarita Mail, PA-C   Patient's potassium 2.9.  Given IV potassium.  She is in obvious fracture and she has been given Ancef, scheduled pain medication.  Chest x-ray is negative pelvic x-ray is negative.  Her other imaging is currently pending.  I have given sign out to PA Geiple.  She has good sensation and pulses in the affected open fracture.  Final Clinical Impressions(s) / ED Diagnoses   Final diagnoses:  None    ED Discharge Orders    None       Margarita Mail, PA-C 02/23/18 1631    Julianne Rice, MD 02/24/18 1546

## 2018-02-23 NOTE — ED Notes (Signed)
Pt linens wet with urine. Pt linens changed. Warm blankets provided.

## 2018-02-23 NOTE — Anesthesia Procedure Notes (Signed)
Anesthesia Regional Block: Axillary brachial plexus block   Pre-Anesthetic Checklist: ,, timeout performed, Correct Patient, Correct Site, Correct Laterality, Correct Procedure, Correct Position, site marked, Risks and benefits discussed,  Surgical consent,  Pre-op evaluation,  At surgeon's request and post-op pain management  Laterality: Left  Prep: chloraprep       Needles:  Injection technique: Single-shot  Needle Type: Stimiplex     Needle Length: 10cm  Needle Gauge: 21     Additional Needles:   Procedures:,,,, ultrasound used (permanent image in chart),,,,  Motor weakness within 5 minutes.  Narrative:  Start time: 02/23/2018 5:53 PM End time: 02/23/2018 5:59 PM Injection made incrementally with aspirations every 5 mL.  Performed by: Personally  Anesthesiologist: Nolon Nations, MD  Additional Notes: Nerve located and needle positioned with direct ultrasound guidance. Good perineural spread. Patient tolerated well.

## 2018-02-23 NOTE — Anesthesia Preprocedure Evaluation (Addendum)
Anesthesia Evaluation  Patient identified by MRN, date of birth, ID band Patient awake    Reviewed: Allergy & Precautions, NPO status , Patient's Chart, lab work & pertinent test results  Airway Mallampati: II  TM Distance: >3 FB Neck ROM: Full    Dental no notable dental hx.    Pulmonary neg pulmonary ROS,    Pulmonary exam normal breath sounds clear to auscultation       Cardiovascular hypertension, negative cardio ROS Normal cardiovascular exam Rhythm:Regular Rate:Normal     Neuro/Psych negative neurological ROS  negative psych ROS   GI/Hepatic Neg liver ROS, GERD  ,  Endo/Other  negative endocrine ROS  Renal/GU negative Renal ROS     Musculoskeletal negative musculoskeletal ROS (+)   Abdominal   Peds  Hematology negative hematology ROS (+)   Anesthesia Other Findings   Reproductive/Obstetrics negative OB ROS                             Anesthesia Physical Anesthesia Plan  ASA: II  Anesthesia Plan: General   Post-op Pain Management: GA combined w/ Regional for post-op pain   Induction: Intravenous, Rapid sequence and Cricoid pressure planned  PONV Risk Score and Plan: 4 or greater and Ondansetron, Dexamethasone and Treatment may vary due to age or medical condition  Airway Management Planned: Oral ETT  Additional Equipment:   Intra-op Plan:   Post-operative Plan: Extubation in OR  Informed Consent: I have reviewed the patients History and Physical, chart, labs and discussed the procedure including the risks, benefits and alternatives for the proposed anesthesia with the patient or authorized representative who has indicated his/her understanding and acceptance.   Dental advisory given  Plan Discussed with: CRNA  Anesthesia Plan Comments:       Anesthesia Quick Evaluation

## 2018-02-23 NOTE — H&P (Signed)
PREOPERATIVE H&P  Chief Complaint: Fall I  HPI: Rebecca Petty is a 84 y.o. female who presents for preoperative history and physical with a diagnosis of left open distal radius fracture and left hip fracture. Symptoms are rated as moderate to severe, and have been worsening.  This is significantly impairing activities of daily living.  She has elected for surgical management.  She is fell on a concrete today.  Acute onset severe pain, unable to walk.  Brought in the emergency room and evaluated.  Pain is worse with movement, there is obvious deformity over the left wrist.  Past Medical History:  Diagnosis Date  . GERD (gastroesophageal reflux disease)   . Hypertension   . Thyroid disease    History reviewed. No pertinent surgical history. Social History   Socioeconomic History  . Marital status: Married    Spouse name: Not on file  . Number of children: Not on file  . Years of education: Not on file  . Highest education level: Not on file  Occupational History  . Not on file  Social Needs  . Financial resource strain: Not on file  . Food insecurity:    Worry: Not on file    Inability: Not on file  . Transportation needs:    Medical: Not on file    Non-medical: Not on file  Tobacco Use  . Smoking status: Not on file  Substance and Sexual Activity  . Alcohol use: Not on file  . Drug use: Not on file  . Sexual activity: Not on file  Lifestyle  . Physical activity:    Days per week: Not on file    Minutes per session: Not on file  . Stress: Not on file  Relationships  . Social connections:    Talks on phone: Not on file    Gets together: Not on file    Attends religious service: Not on file    Active member of club or organization: Not on file    Attends meetings of clubs or organizations: Not on file    Relationship status: Not on file  Other Topics Concern  . Not on file  Social History Narrative  . Not on file   History reviewed. No pertinent family  history. No Known Allergies Prior to Admission medications   Medication Sig Start Date End Date Taking? Authorizing Provider  amLODipine (NORVASC) 5 MG tablet Take 5 mg by mouth daily. 02/23/18  Yes [provider]  fluticasone (FLOVENT HFA) 220 MCG/ACT inhaler Inhale 1-2 puffs into the lungs 2 (two) times daily. 12/24/17  Yes [provider]  omeprazole (PRILOSEC) 40 MG capsule Take 40 mg by mouth daily. 02/18/18  Yes [provider]  tiotropium (SPIRIVA HANDIHALER) 18 MCG inhalation capsule Place 18 mcg into inhaler and inhale daily. 10/14/17  Yes [provider]  levothyroxine (SYNTHROID, LEVOTHROID) 88 MCG tablet Take 88 mcg by mouth daily. 02/18/18   [provider]     Positive ROS: All other systems have been reviewed and were otherwise negative with the exception of those mentioned in the HPI and as above.  Physical Exam: General: Alert, no acute distress, patient interacts with me relatively normal and does answer questions although is hard of hearing and does have some dementia. Cardiovascular: No pedal edema Respiratory: No cyanosis, no use of accessory musculature GI: No organomegaly, abdomen is soft and non-tender Skin: She has an abrasion over her left shoulder, her left upper extremity is wrapped in a splint.  Neurologic: She has decreased sensation in her hand, although she did already receive a nerve block.  Her exam per report of the emergency room physicians was that she was intact. Psychiatric: Patient has family at the bedside who are competent for consent, she seems to have normal mood and affect Lymphatic: No axillary or cervical lymphadenopathy  MUSCULOSKELETAL: Left upper extremity has significant deformity, positive pain diffusely around the upper extremity although it is reduced with the block.  Left lower extremity has positive logroll, I did not test this extremity aggressively, EHL and FHL are intact.  Assessment: Open  left distal radius fracture, closed left hip fracture.   Plan: Plan for Procedure(s): OPEN REDUCTION INTERNAL FIXATION (ORIF) DISTAL RADIAL FRACTURE with carpal tunnel release   possible EXTERNAL FIXATION ARM, I&D  CANNULATED HIP PINNING left  The risks benefits and alternatives were discussed with the patient including but not limited to the risks of nonoperative treatment, versus surgical intervention including infection, bleeding, nerve injury, malunion, nonunion, the need for revision surgery, hardware prominence, hardware failure, the need for hardware removal, blood clots, cardiopulmonary complications, AVN, morbidity, mortality, among others, and they were willing to proceed.     Johnny Bridge, MD Cell [phone]   02/23/2018 6:24 PM

## 2018-02-23 NOTE — H&P (Signed)
History and Physical    Rebecca Petty LDJ:[id number] DOB: [birth year] DOA: 02/23/2018  Referring MD/NP/PA: Dr Mardelle Matte PCP: No primary care provider on file.   Outpatient Specialists: None   Patient coming from: [facility]  Chief Complaint: Fall  HPI: Rebecca Petty is a 84 y.o. female with medical history significant of hypertension hypothyroidism as well as GERD who was at [employer] and had a mechanical fall. She sustains left femur neck fracture and left distal ulna and radius fracture. She was brought to the emergency room where she was immediately taken to the OR for repair of her fractures. The injury was severe and would not wait much longer. Patient is now postoperatively. She appears stable and calm. No new complaint. Denied hitting her head.   ED Course: patient was found to have settled 442 and potassium 2.9. She had findings on x-ray showing fracture of the wrist as well as femoral neck fractures. EKG stable and the lab work was stable  Review of Systems: As per HPI otherwise 10 point review of systems negative.    Past Medical History:  Diagnosis Date  . Fracture of distal radius and ulna, left, open type III, initial encounter 02/23/2018  . Fracture of femoral neck, left ([facility]) 02/23/2018  . GERD (gastroesophageal reflux disease)   . Hypertension   . Thyroid disease     History reviewed. No pertinent surgical history.   has no tobacco, alcohol, and drug history on file.  No Known Allergies  History reviewed. No pertinent family history.   Prior to Admission medications   Medication Sig Start Date End Date Taking? Authorizing Provider  amLODipine (NORVASC) 5 MG tablet Take 5 mg by mouth daily. 02/23/18  Yes [provider]  fluticasone (FLOVENT HFA) 220 MCG/ACT inhaler Inhale 1-2 puffs into the lungs 2 (two) times daily. 12/24/17  Yes [provider]  omeprazole (PRILOSEC) 40 MG capsule Take 40 mg by mouth daily. 02/18/18  Yes [provider]  tiotropium (SPIRIVA HANDIHALER) 18 MCG inhalation capsule Place 18 mcg into inhaler and inhale daily. 10/14/17  Yes [provider]  levothyroxine (SYNTHROID, LEVOTHROID) 88 MCG tablet Take 88 mcg by mouth daily. 02/18/18   [provider]    Physical Exam: Vitals:   02/23/18 1545 02/23/18 1615 02/23/18 1645 02/23/18 1715  BP: (!) 141/83 (!) 164/92 131/71 (!) 161/92  Pulse: 87 81 94 88  Resp: 19 17  16   Temp:      TempSrc:      SpO2: 98% 97% 99% 99%  Weight:      Height:          Constitutional: NAD, calm, comfortable Vitals:   02/23/18 1545 02/23/18 1615 02/23/18 1645 02/23/18 1715  BP: (!) 141/83 (!) 164/92 131/71 (!) 161/92  Pulse: 87 81 94 88  Resp: 19 17  16   Temp:      TempSrc:      SpO2: 98% 97% 99% 99%  Weight:      Height:       Eyes: PERRL, lids and conjunctivae normal ENMT: Mucous membranes are moist. Posterior pharynx clear of any exudate or lesions.Normal dentition.  Neck: normal, supple, no masses, no thyromegaly Respiratory: clear to auscultation bilaterally, no wheezing, no crackles. Normal respiratory effort. No accessory muscle use.  Cardiovascular: Regular rate and rhythm, no murmurs / rubs / gallops. No extremity edema. 2+ pedal pulses. No carotid bruits.  Abdomen: no tenderness, no masses palpated. No hepatosplenomegaly. Bowel sounds positive.  Musculoskeletal:  patient's right wrist and upper extremity now immobilized after surgery. Decreased range of motion in the lower extremities Skin: multiple bruises and lesions, ulcers. No induration Neurologic: CN 2-12 grossly intact. Sensation intact, DTR normal. Strength 5/5 in all 4.  Psychiatric: Normal judgment and insight. Alert and oriented x 3. Normal mood.   Labs on Admission: I have personally reviewed following labs and imaging studies  CBC: Recent Labs  Lab 02/23/18 1416 02/23/18 1434  WBC 11.5*  --   HGB 14.2 13.9  HCT 41.4 41.0  MCV 91.8  --   PLT 166  --    Basic  Metabolic Panel: Recent Labs  Lab 02/23/18 1416 02/23/18 1434  NA 140 141  K 2.9* 2.9*  CL 109 106  CO2 20*  --   GLUCOSE 144* 143*  BUN 11 13  CREATININE 0.70 0.70  CALCIUM 8.3*  --    GFR: Estimated Creatinine Clearance: 40.9 mL/min (by C-G formula based on SCr of 0.7 mg/dL). Liver Function Tests: Recent Labs  Lab 02/23/18 1416  AST 21  ALT 16  ALKPHOS 88  BILITOT 1.0  PROT 6.1*  ALBUMIN 3.7   No results for input(s): LIPASE, AMYLASE in the last 168 hours. No results for input(s): AMMONIA in the last 168 hours. Coagulation Profile: Recent Labs  Lab 02/23/18 1416  INR 1.03   Cardiac Enzymes: No results for input(s): CKTOTAL, CKMB, CKMBINDEX, TROPONINI in the last 168 hours. BNP (last 3 results) No results for input(s): PROBNP in the last 8760 hours. HbA1C: No results for input(s): HGBA1C in the last 72 hours. CBG: No results for input(s): GLUCAP in the last 168 hours. Lipid Profile: No results for input(s): CHOL, HDL, LDLCALC, TRIG, CHOLHDL, LDLDIRECT in the last 72 hours. Thyroid Function Tests: No results for input(s): TSH, T4TOTAL, FREET4, T3FREE, THYROIDAB in the last 72 hours. Anemia Panel: No results for input(s): VITAMINB12, FOLATE, FERRITIN, TIBC, IRON, RETICCTPCT in the last 72 hours. Urine analysis:    Component Value Date/Time   COLORURINE YELLOW 02/23/2018 1510   APPEARANCEUR HAZY (A) 02/23/2018 1510   LABSPEC 1.009 02/23/2018 1510   PHURINE 6.0 02/23/2018 1510   GLUCOSEU NEGATIVE 02/23/2018 1510   HGBUR MODERATE (A) 02/23/2018 1510   BILIRUBINUR NEGATIVE 02/23/2018 1510   KETONESUR 5 (A) 02/23/2018 1510   PROTEINUR NEGATIVE 02/23/2018 1510   NITRITE NEGATIVE 02/23/2018 1510   LEUKOCYTESUR MODERATE (A) 02/23/2018 1510   Sepsis Labs: @LABRCNTIP (procalcitonin:4,lacticidven:4) )No results found for this or any previous visit (from the past 240 hour(s)).   Radiological Exams on Admission: Dg Elbow 2 Views Left  Result Date:  02/23/2018 CLINICAL DATA:  Fall EXAM: LEFT ELBOW - 2 VIEW COMPARISON:  None. FINDINGS: There is no evidence of fracture, dislocation, or joint effusion. There is no evidence of arthropathy or other focal bone abnormality. Soft tissues are unremarkable. IMPRESSION: Negative. Electronically Signed   By: Franchot Gallo M.D.   On: 02/23/2018 17:27   Dg Wrist 2 Views Left  Result Date: 02/23/2018 CLINICAL DATA:  Fall EXAM: LEFT WRIST - 2 VIEW COMPARISON:  None. FINDINGS: Image quality degraded by plaster splint. Comminuted fracture distal radius with radiocarpal dislocation. Suboptimal anatomical evaluation, consider CT for further evaluation. Questionable fracture distal ulna. IMPRESSION: Fracture dislocation of the wrist. Recommend CT for better anatomical evaluation. Electronically Signed   By: Franchot Gallo M.D.   On: 02/23/2018 17:26   Dg Wrist Complete Left  Result Date: 02/23/2018 CLINICAL DATA:  Distal radius fracture, external fixation EXAM: LEFT WRIST -  COMPLETE 3+ VIEW; DG C-ARM 61-120 MIN COMPARISON:  None. FINDINGS: Spot fluoroscopic views during external fixation of left wrist complex comminuted intra-articular fracture. There is improved alignment at the wrist joint with residual displacement of the distal radius fragments. Bones are osteopenic. IMPRESSION: Status post external fixation of the left wrist fracture dislocation with improved alignment. Electronically Signed   By: Jerilynn Mages.  Shick M.D.   On: 02/23/2018 21:36   Ct Head Wo Contrast  Result Date: 02/23/2018 CLINICAL DATA:  Fall, head injury, left frontal scalp hematoma EXAM: CT HEAD WITHOUT CONTRAST TECHNIQUE: Contiguous axial images were obtained from the base of the skull through the vertex without intravenous contrast. COMPARISON:  02/08/2017 FINDINGS: Brain: Age related brain atrophy and extensive chronic white matter microvascular ischemic changes throughout both cerebral hemispheres. No acute intracranial hemorrhage, mass lesion, new  infarction, midline shift, herniation, hydrocephalus, or extra-axial fluid collection. Cisterns are patent. Cerebellar atrophy as well. Vascular: Intracranial atherosclerosis noted.  No hyperdense vessel. Skull: Left frontal scalp acute soft tissue hematoma. No underlying skull fracture. Sinuses/Orbits: Sinuses and mastoids are clear.  Symmetric orbits. Other: None. IMPRESSION: Atrophy and extensive chronic white matter microvascular ischemic changes. Acute left anterior frontal scalp hematoma without intracranial hemorrhage or skull fracture. Electronically Signed   By: Jerilynn Mages.  Shick M.D.   On: 02/23/2018 17:00   Dg Pelvis Portable  Result Date: 02/23/2018 CLINICAL DATA:  Fall today EXAM: PORTABLE PELVIS 1-2 VIEWS COMPARISON:  02/08/2017 FINDINGS: There is no evidence of pelvic fracture or diastasis. No pelvic bone lesions are seen. Atherosclerotic calcification. IMPRESSION: Negative. Electronically Signed   By: Franchot Gallo M.D.   On: 02/23/2018 14:23   Dg Chest Port 1 View  Result Date: 02/23/2018 CLINICAL DATA:  Fall today EXAM: PORTABLE CHEST 1 VIEW COMPARISON:  06/11/2017 FINDINGS: Heart size within normal limits. Negative for heart failure. Atherosclerotic aortic arch. Lungs are clear. Apical scarring left greater than right IMPRESSION: No active disease. Electronically Signed   By: Franchot Gallo M.D.   On: 02/23/2018 14:22   Dg Shoulder Left Port  Result Date: 02/23/2018 CLINICAL DATA:  Fall EXAM: LEFT SHOULDER - 1 VIEW COMPARISON:  None. FINDINGS: There is no evidence of fracture or dislocation. There is no evidence of arthropathy or other focal bone abnormality. Soft tissues are unremarkable. IMPRESSION: Negative. Electronically Signed   By: Franchot Gallo M.D.   On: 02/23/2018 17:24   Dg C-arm 1-60 Min  Result Date: 02/23/2018 CLINICAL DATA:  Distal radius fracture, external fixation EXAM: LEFT WRIST - COMPLETE 3+ VIEW; DG C-ARM 61-120 MIN COMPARISON:  None. FINDINGS: Spot fluoroscopic  views during external fixation of left wrist complex comminuted intra-articular fracture. There is improved alignment at the wrist joint with residual displacement of the distal radius fragments. Bones are osteopenic. IMPRESSION: Status post external fixation of the left wrist fracture dislocation with improved alignment. Electronically Signed   By: Jerilynn Mages.  Shick M.D.   On: 02/23/2018 21:36   Dg Hip Unilat W Or Wo Pelvis 2-3 Views Left  Result Date: 02/23/2018 CLINICAL DATA:  Left hip pain and stiffness EXAM: DG HIP (WITH OR WITHOUT PELVIS) 2-3V LEFT COMPARISON:  02/08/2017 FINDINGS: There is cortical irregularity of the proximal left hip surgical neck with mild foreshortening suspicious for a impacted subcapital femoral neck fracture. Bones are osteopenic. Visualized left hemipelvis intact. Peripheral atherosclerosis noted. IMPRESSION: Findings suspicious for a subtle proximal left hip subcapital femoral neck impacted fracture. Osteopenia Atherosclerosis Electronically Signed   By: Jerilynn Mages.  Shick M.D.   On:  02/23/2018 17:44    EKG: Independently reviewed. Sinus rhythm with prolonged PR interval and multiple mild conduction abnormalities  Assessment/Plan Principal Problem:   Fracture of femoral neck, left (HCC) Active Problems:   Fracture of distal radius and ulna, left, open type III, initial encounter   Hypokalemia   Femoral fracture (HCC)     1. Left femoral neck fracture: status post closed reduction with percutaneous pinned fixation. Patient is postoperative now. Continue care according to Orthopedics. Pain management was physical therapy and occupational therapy.  2. Left distal radial and ulnar fractures: status post surgical repair. Continue close monitoring  3. GERD: Continue PPIs  4. Status post mechanical fall: gait assessment by PT and OT  5. Hypothyroidism: Continue levothyroxine  6. Hypokalemia: Repleted in the ER. Recheck level in the morning   DVT prophylaxis: Lovenox  Code  Status: Full  Family Communication: None available now Disposition Plan: SNF  Consults called: Dr Marchia Bond, Ortho Admission status: inpatient   Severity of Illness: The appropriate patient status for this patient is INPATIENT. Inpatient status is judged to be reasonable and necessary in order to provide the required intensity of service to ensure the patient's safety. The patient's presenting symptoms, physical exam findings, and initial radiographic and laboratory data in the context of their chronic comorbidities is felt to place them at high risk for further clinical deterioration. Furthermore, it is not anticipated that the patient will be medically stable for discharge from the hospital within 2 midnights of admission. The following factors support the patient status of inpatient.   " The patient's presenting symptoms include fall with open injury of the left wrist. " The worrisome physical exam findings include open injury of the left wrist and valgus of the left. " The initial radiographic and laboratory data are worrisome because of evidence of fracture of both wrist and hip. " The chronic co-morbidities include hypertension and hypothyroidism.   * I certify that at the point of admission it is my clinical judgment that the patient will require inpatient hospital care spanning beyond 2 midnights from the point of admission due to high intensity of service, high risk for further deterioration and high frequency of surveillance required.Barbette Merino MD [facility] Pager [phone]  If 7PM-7AM, please contact night-coverage [email or web address] Password [email or web address]  02/23/2018, 10:16 PM

## 2018-02-23 NOTE — Op Note (Signed)
02/23/2018  9:11 PM  PATIENT:  Rebecca Petty    PRE-OPERATIVE DIAGNOSIS:    1.  Open grade 3 left distal radius and ulna fracture 2.  Left femoral neck fracture, valgus impacted  POST-OPERATIVE DIAGNOSIS:    1.  Open grade 3 left distal radius and ulna fracture 2.  Left femoral neck fracture, valgus impacted 3.  Left shoulder skin tear measuring 5 x 5 cm       PROCEDURE:    1.  Left distal radius and ulna open irrigation, debridement, skin, subcutaneous tissue, and bone 2.  Application of external fixator to the left distal radius going from metacarpal to radial shaft 3.  Complex wound closure, 15 cm open wound on the ulnar side of the distal forearm 4.  Repair of degloving skin injury to the left shoulder, 5 x 5 cm 5.  Closed reduction with percutaneous pin fixation left femoral neck  SURGEON:  Johnny Bridge, MD  PHYSICIAN ASSISTANT: Joya Gaskins, OPA-C, present and scrubbed throughout the case, critical for completion in a timely fashion, and for retraction, instrumentation, and closure.  ANESTHESIA:   General with regional block  PREOPERATIVE INDICATIONS:  Rebecca Petty is a  84 y.o. female who fell on the concrete today and had an open grade 3 left distal radius and ulna fracture with substantial soft tissue injury.  She has extremely frail skin.  She was also found to have a femoral neck fracture.  The risks benefits and alternatives were discussed with the patient and the family preoperatively including but not limited to the risks of infection, bleeding, nerve injury, cardiopulmonary complications, the need for revision surgery, among others, and the patient was willing to proceed.  We also discussed the potential for external fixation, the potential need for removal of external fixation, the possibility of future internal fixation, and the possibility of traumatic carpal tunnel syndrome.  On my exam preoperatively, she had numbness in the hand, although this was  after her regional block.  Initially I thought she was developing a carpal tunnel syndrome traumatically, however the emergency room exam, and my assistance exam, both of which were done before the nerve block mistreated intact median nerve function.  Additionally, the hand was extremely soft, and I did not feel that there was significant swelling around the nerve within the carpal tunnel, so ultimately given the extreme poor quality skin, I did not feel that making another incision over her carpal tunnel would provide any clinical benefit, so I elected not to perform a carpal tunnel release.  ESTIMATED BLOOD LOSS: 173ml  OPERATIVE IMPLANTS: Zimmer external fixator for the radius and ulna.  6.5 mm cannulated screws x3 for the hip.  The screws were also from Zimmer.  OPERATIVE FINDINGS: The distal ulna and radius was open, and had exposed bone soft tissue tendon and muscle as well as the joint of the DRUJ.  The ulnar styloid was fractured.  The radius had extreme comminution.  The skin quality was extremely poor, and basically had degloving, the pin sites for the external fixator expanded in size just by gentle manipulation, and wound the skin was so fragile that many of the sutures would not even hold within the skin and pulled directly through the tissue.  The consistency was that of wet paper.  The radius had extreme comminution.  The hip fracture was valgus, impacted, and appeared amenable to percutaneous fixation.  Over the left shoulder there was a 5 x 5 cm area of  degloved skin which I cleaned and then repaired with a stapler.  OPERATIVE PROCEDURE: The patient was brought to the operating room and placed in the supine position.  General anesthesia was administered.  IV antibiotics were given in the form of Ancef.  The left upper extremity was prepped and draped in usual sterile fashion using Betadine.  This was a scrub and paint.  There was substantial soft tissue devitalization and injury,  although the wound was not grossly contaminated but it was grossly exposed.  The ulnar was present within the field.  I performed an irrigation and debridement of the skin, subcutaneous tissue, and bone, using a curette, a gauze, and a pickup.  I irrigated 9 L of fluid through the open fracture and wound.  Once this had been copiously irrigated, I went to the fixation portion of the case.  The carpal canal was quite soft palpably, the radial shaft was palpated, a small incision about 3 mm was made for the first pin site.  I bluntly dissected down trying to take care to minimize risk to the tendons and radial nerve.  I introduced a 4 mm pin proximally, and placed it bicortically.  At the completion of the placement of that pin, it appeared that the gentle blunt dissection had caused an extension of the incision to almost triple the size that I made with the scalpel, her skin was so delicate.  I placed a second guidepin proximally, and then placed 2 guide pins distally doing the best I could to prevent further extension of skin tears.  Her skin was so friable.  The pins were in the index metacarpal and the radius, all of the pins were bicortical but not prominent on the far side, C arm was used to confirm position.  I performed a gentle reduction of the fracture site, applied a total of 2 bar across the pins, secured it proximally, manipulated the wrist back to is close to anatomic position as possible.  The comminution of the radius was extreme.  After satisfactory joint alignment was achieved, I irrigated once more, attempted to repair some of the skin tears from the pin holes in the radius and forearm, although the skin was still such poor quality that they did not hold a nylon stitch.  I repaired the skin flap degloving injury from the open grade 3 fracture which was a fairly complicated skin repair with nylon suture, repairing over 15 cm.  The pin sites were dressed with sterile gauze using  Xeroform and a bulky dressing.  A light compressive wrap was applied.  We took extreme care to try and take down the drapes without damaging the skin any further.  Inspection of the shoulder demonstrated a partial thickness degloving injury that was about 5 x 5 cm.  There is a small area of skin loss.  I used a stapler to bring the skin edges back to the appropriate location, it had the consistency of a very thin the skin graft.  I dressed that with nonstick Adaptic gauze and paper tape.  Under sterile separate prep and drape I performed the left hip cannulated pinning.  The operative extremity was positioned, without any significant reduction maneuver and was prepped and draped in usual sterile fashion.  Time out was performed.  Small incision was made distal to the greater trochanter, and 3 guidewires were introduced Into an inverted triangle configuration. The lengths were measured. The reduction was slightly valgus, and near-anatomic. I opened the cortex with a  cannulated drill, and then placed the screws into position. Satisfactory fixation was achieved.  The superior screws measured 84 mm, I used an 85 and they approached but did not violate the articular surface.  I did feel that the additional length would hopefully minimize the risk for loss of fixation.  The wounds were irrigated copiously, and repaired with Vicryl with Steri-Strips and sterile gauze. There no complications and the patient tolerated the procedure well.  The patient will be weightbearing as tolerated, and will be on Lovenox for a period of 4 weeks after discharge for DVT prophylaxis.

## 2018-02-23 NOTE — ED Notes (Signed)
1st round of 3 of potassium started.

## 2018-02-24 ENCOUNTER — Inpatient Hospital Stay (HOSPITAL_COMMUNITY): Payer: Medicare Other

## 2018-02-24 ENCOUNTER — Encounter (HOSPITAL_COMMUNITY): Payer: Self-pay | Admitting: Orthopedic Surgery

## 2018-02-24 DIAGNOSIS — I10 Essential (primary) hypertension: Secondary | ICD-10-CM

## 2018-02-24 DIAGNOSIS — S52502C Unspecified fracture of the lower end of left radius, initial encounter for open fracture type IIIA, IIIB, or IIIC: Secondary | ICD-10-CM

## 2018-02-24 DIAGNOSIS — S52602C Unspecified fracture of lower end of left ulna, initial encounter for open fracture type IIIA, IIIB, or IIIC: Secondary | ICD-10-CM

## 2018-02-24 DIAGNOSIS — K219 Gastro-esophageal reflux disease without esophagitis: Secondary | ICD-10-CM

## 2018-02-24 DIAGNOSIS — E876 Hypokalemia: Secondary | ICD-10-CM

## 2018-02-24 LAB — COMPREHENSIVE METABOLIC PANEL
ALT: 13 U/L — ABNORMAL LOW (ref 14–54)
ANION GAP: 9 (ref 5–15)
AST: 26 U/L (ref 15–41)
Albumin: 2.9 g/dL — ABNORMAL LOW (ref 3.5–5.0)
Alkaline Phosphatase: 67 U/L (ref 38–126)
BUN: 16 mg/dL (ref 6–20)
CALCIUM: 7.9 mg/dL — AB (ref 8.9–10.3)
CO2: 21 mmol/L — AB (ref 22–32)
Chloride: 108 mmol/L (ref 101–111)
Creatinine, Ser: 0.97 mg/dL (ref 0.44–1.00)
GFR, EST AFRICAN AMERICAN: 58 mL/min — AB (ref >60)
GFR, EST NON AFRICAN AMERICAN: 50 mL/min — AB (ref >60)
Glucose, Bld: 151 mg/dL — ABNORMAL HIGH (ref 65–99)
Potassium: 3.9 mmol/L (ref 3.5–5.1)
SODIUM: 138 mmol/L (ref 135–145)
TOTAL PROTEIN: 4.9 g/dL — AB (ref 6.5–8.1)
Total Bilirubin: 0.8 mg/dL (ref 0.3–1.2)

## 2018-02-24 LAB — CBC
HCT: 31.3 % — ABNORMAL LOW (ref 36.0–46.0)
HEMOGLOBIN: 10.6 g/dL — AB (ref 12.0–15.0)
MCH: 31.4 pg (ref 26.0–34.0)
MCHC: 33.9 g/dL (ref 30.0–36.0)
MCV: 92.6 fL (ref 78.0–100.0)
PLATELETS: 160 10*3/uL (ref 150–400)
RBC: 3.38 MIL/uL — ABNORMAL LOW (ref 3.87–5.11)
RDW: 12.7 % (ref 11.5–15.5)
WBC: 8.5 10*3/uL (ref 4.0–10.5)

## 2018-02-24 LAB — GLUCOSE, CAPILLARY: Glucose-Capillary: 158 mg/dL — ABNORMAL HIGH (ref 65–99)

## 2018-02-24 MED ORDER — TIOTROPIUM BROMIDE MONOHYDRATE 18 MCG IN CAPS
18.0000 ug | ORAL_CAPSULE | Freq: Every day | RESPIRATORY_TRACT | Status: DC
  Administered 2018-02-24 – 2018-02-28 (×5): 18 ug via RESPIRATORY_TRACT
  Filled 2018-02-24 (×2): qty 5

## 2018-02-24 MED ORDER — ACETAMINOPHEN 325 MG PO TABS: 325.0000 mg | ORAL_TABLET | Freq: Four times a day (QID) | ORAL | Status: DC | PRN

## 2018-02-24 MED ORDER — MORPHINE SULFATE (PF) 2 MG/ML IV SOLN
0.5000 mg | INTRAVENOUS | Status: DC | PRN
  Administered 2018-02-24: 1 mg via INTRAVENOUS
  Administered 2018-02-24: 0.5 mg via INTRAVENOUS
  Administered 2018-02-25 – 2018-02-26 (×3): 1 mg via INTRAVENOUS
  Filled 2018-02-24 (×5): qty 1

## 2018-02-24 MED ORDER — LORAZEPAM 2 MG/ML IJ SOLN
1.0000 mg | INTRAMUSCULAR | Status: DC | PRN
  Administered 2018-02-24: 1 mg via INTRAVENOUS
  Filled 2018-02-24: qty 1

## 2018-02-24 MED ORDER — MAGNESIUM CITRATE PO SOLN: 1.0000 | Freq: Once | ORAL | Status: DC | PRN

## 2018-02-24 MED ORDER — ACETAMINOPHEN 500 MG PO TABS
500.0000 mg | ORAL_TABLET | Freq: Four times a day (QID) | ORAL | Status: AC
  Administered 2018-02-24 (×3): 500 mg via ORAL
  Filled 2018-02-24 (×4): qty 1

## 2018-02-24 MED ORDER — ALUM & MAG HYDROXIDE-SIMETH 200-200-20 MG/5ML PO SUSP
30.0000 mL | ORAL | Status: DC | PRN
  Administered 2018-02-24: 30 mL via ORAL
  Filled 2018-02-24: qty 30

## 2018-02-24 MED ORDER — ENOXAPARIN SODIUM 30 MG/0.3ML ~~LOC~~ SOLN
30.0000 mg | SUBCUTANEOUS | Status: DC
  Administered 2018-02-24 – 2018-02-28 (×5): 30 mg via SUBCUTANEOUS
  Filled 2018-02-24 (×5): qty 0.3

## 2018-02-24 MED ORDER — BUDESONIDE 0.25 MG/2ML IN SUSP
0.2500 mg | Freq: Two times a day (BID) | RESPIRATORY_TRACT | Status: DC
  Administered 2018-02-24 – 2018-02-28 (×9): 0.25 mg via RESPIRATORY_TRACT
  Filled 2018-02-24 (×9): qty 2

## 2018-02-24 MED ORDER — LEVOTHYROXINE SODIUM 88 MCG PO TABS
88.0000 ug | ORAL_TABLET | Freq: Every day | ORAL | Status: DC
  Administered 2018-02-24 – 2018-02-28 (×5): 88 ug via ORAL
  Filled 2018-02-24 (×5): qty 1

## 2018-02-24 MED ORDER — POLYETHYLENE GLYCOL 3350 17 G PO PACK
17.0000 g | PACK | Freq: Every day | ORAL | Status: DC | PRN
  Administered 2018-02-28: 17 g via ORAL
  Filled 2018-02-24: qty 1

## 2018-02-24 MED ORDER — MENTHOL 3 MG MT LOZG
1.0000 | LOZENGE | OROMUCOSAL | Status: DC | PRN
Start: 2018-02-24 — End: 2018-02-28

## 2018-02-24 MED ORDER — AMLODIPINE BESYLATE 5 MG PO TABS
5.0000 mg | ORAL_TABLET | Freq: Every day | ORAL | Status: DC
  Administered 2018-02-24 – 2018-02-28 (×5): 5 mg via ORAL
  Filled 2018-02-24 (×5): qty 1

## 2018-02-24 MED ORDER — BISACODYL 10 MG RE SUPP
10.0000 mg | Freq: Every day | RECTAL | Status: DC | PRN
  Filled 2018-02-24: qty 1

## 2018-02-24 MED ORDER — HYDROCODONE-ACETAMINOPHEN 5-325 MG PO TABS
1.0000 | ORAL_TABLET | ORAL | Status: DC | PRN
  Administered 2018-02-24: 1 via ORAL
  Administered 2018-02-25: 2 via ORAL
  Administered 2018-02-25 – 2018-02-27 (×4): 1 via ORAL
  Filled 2018-02-24 (×4): qty 1
  Filled 2018-02-24: qty 2
  Filled 2018-02-24: qty 1

## 2018-02-24 MED ORDER — FERROUS SULFATE 325 (65 FE) MG PO TABS
325.0000 mg | ORAL_TABLET | Freq: Three times a day (TID) | ORAL | Status: DC
  Administered 2018-02-24 – 2018-02-26 (×6): 325 mg via ORAL
  Filled 2018-02-24 (×6): qty 1

## 2018-02-24 MED ORDER — DOCUSATE SODIUM 100 MG PO CAPS
100.0000 mg | ORAL_CAPSULE | Freq: Two times a day (BID) | ORAL | Status: DC
  Administered 2018-02-24 – 2018-02-26 (×3): 100 mg via ORAL
  Filled 2018-02-24 (×4): qty 1

## 2018-02-24 MED ORDER — PHENOL 1.4 % MT LIQD
1.0000 | OROMUCOSAL | Status: DC | PRN
  Administered 2018-02-24: 1 via OROMUCOSAL
  Filled 2018-02-24: qty 177

## 2018-02-24 MED ORDER — ONDANSETRON HCL 4 MG PO TABS: 4.0000 mg | ORAL_TABLET | Freq: Four times a day (QID) | ORAL | Status: DC | PRN

## 2018-02-24 MED ORDER — LEVETIRACETAM IN NACL 1000 MG/100ML IV SOLN
1000.0000 mg | Freq: Two times a day (BID) | INTRAVENOUS | Status: DC
  Administered 2018-02-24: 1000 mg via INTRAVENOUS
  Filled 2018-02-24 (×2): qty 100

## 2018-02-24 MED ORDER — ENOXAPARIN SODIUM 40 MG/0.4ML ~~LOC~~ SOLN: 40.0000 mg | SUBCUTANEOUS | Status: DC

## 2018-02-24 MED ORDER — ONDANSETRON HCL 4 MG/2ML IJ SOLN: 4.0000 mg | Freq: Four times a day (QID) | INTRAMUSCULAR | Status: DC | PRN

## 2018-02-24 MED ORDER — PANTOPRAZOLE SODIUM 40 MG PO TBEC
40.0000 mg | DELAYED_RELEASE_TABLET | Freq: Every day | ORAL | Status: DC
  Administered 2018-02-24 – 2018-02-28 (×5): 40 mg via ORAL
  Filled 2018-02-24 (×5): qty 1

## 2018-02-24 MED ORDER — CEFAZOLIN SODIUM-DEXTROSE 1-4 GM/50ML-% IV SOLN
1.0000 g | Freq: Three times a day (TID) | INTRAVENOUS | Status: AC
  Administered 2018-02-24 – 2018-02-26 (×7): 1 g via INTRAVENOUS
  Filled 2018-02-24 (×7): qty 50

## 2018-02-24 MED ORDER — CEFAZOLIN SODIUM-DEXTROSE 1-4 GM/50ML-% IV SOLN
1.0000 g | Freq: Four times a day (QID) | INTRAVENOUS | Status: DC
  Filled 2018-02-24: qty 50

## 2018-02-24 MED ORDER — ONDANSETRON HCL 4 MG PO TABS
4.0000 mg | ORAL_TABLET | Freq: Four times a day (QID) | ORAL | Status: DC | PRN
Start: 2018-02-24 — End: 2018-02-24

## 2018-02-24 MED ORDER — ONDANSETRON HCL 4 MG/2ML IJ SOLN
4.0000 mg | Freq: Four times a day (QID) | INTRAMUSCULAR | Status: DC | PRN
  Administered 2018-02-24 – 2018-02-25 (×2): 4 mg via INTRAVENOUS
  Filled 2018-02-24 (×2): qty 2

## 2018-02-24 MED ORDER — CEFAZOLIN SODIUM-DEXTROSE 2-4 GM/100ML-% IV SOLN
2.0000 g | Freq: Four times a day (QID) | INTRAVENOUS | Status: DC
  Administered 2018-02-24 (×3): 2 g via INTRAVENOUS
  Filled 2018-02-24 (×5): qty 100

## 2018-02-24 MED ORDER — METOCLOPRAMIDE HCL 5 MG/ML IJ SOLN
5.0000 mg | Freq: Three times a day (TID) | INTRAMUSCULAR | Status: DC | PRN
  Administered 2018-02-28: 10 mg via INTRAVENOUS
  Filled 2018-02-24: qty 2

## 2018-02-24 MED ORDER — SODIUM CHLORIDE 0.9 % IV SOLN: INTRAVENOUS | Status: DC

## 2018-02-24 MED ORDER — POTASSIUM CHLORIDE IN NACL 20-0.9 MEQ/L-% IV SOLN
INTRAVENOUS | Status: DC
  Administered 2018-02-24 (×2): via INTRAVENOUS
  Filled 2018-02-24 (×3): qty 1000

## 2018-02-24 MED ORDER — METOCLOPRAMIDE HCL 5 MG PO TABS: 5.0000 mg | ORAL_TABLET | Freq: Three times a day (TID) | ORAL | Status: DC | PRN

## 2018-02-24 NOTE — Progress Notes (Signed)
PHARMACY NOTE:  ANTIMICROBIAL RENAL DOSAGE ADJUSTMENT  Current antimicrobial regimen includes a mismatch between antimicrobial dosage and estimated renal function.  As per policy approved by the Pharmacy & Therapeutics and Medical Executive Committees, the antimicrobial dosage will be adjusted accordingly.  Current antimicrobial dosage:  Ancef 2gm IV Q6H x 3 days  Indication: surgical prophylaxis  Renal Function:  Estimated Creatinine Clearance: 33.8 mL/min (by C-G formula based on SCr of 0.97 mg/dL). []      On intermittent HD, scheduled: []      On CRRT    Antimicrobial dosage has been changed to:  Ancef 1gm IV Q8H x 3 days total (elderly with borderline CrCL and low weight)  Additional comments:   Thank you for allowing pharmacy to be a part of this patient's care.  Debarah Mccumbers D. Mina Marble, PharmD, BCPS, [place] Pager:  [phone] 02/24/2018, 2:16 PM

## 2018-02-24 NOTE — Progress Notes (Signed)
Patient arrived from PACU with small amount of blood noted at external fixator pin sites. Patient alert and oriented x4. Patient complains of been cold. Warm blankets placed on patient  and room temperature increased. MD orders reviewed with patient.

## 2018-02-24 NOTE — Evaluation (Signed)
Physical Therapy Evaluation Patient Details Name: Rebecca Petty MRN: [id number] DOB: 1926/09/26 Today's Date: 02/24/2018   History of Present Illness  Pt is an 84 y/o female who presents s/p fall, sustaining an open grade 3 left distal radius and ulna fracture, L femoral neck shaft fracture, and shoulder skin tear measuring 5x5 cm. Pt is now s/p external fixator to the L distal radius, repair of degloving skin injury to the L shoulder, and closed reduction with percutaneous pin fixation of the L femoral neck. Pt is currently NWB to the L wrist, WBAT through the L elbow, and WBAT through the LLE. PMH significant for thyroid disease, HTN.  Clinical Impression  Pt admitted with above diagnosis. Pt currently with functional limitations due to the deficits listed below (see PT Problem List). At the time of PT eval, pt was able to perform transfers to EOB and bed>chair with up to +2 total assist. Pt pleasant and willing to work with therapy, however tolerance for functional activity is low at this time. Pt requires cues to reorient to time and situation. Feel pt will benefit most from 24 hour assistance and continued rehab at the SNF level at d/c. Acutely, pt will benefit from skilled PT to increase their independence and safety with mobility to allow discharge to the venue listed below.       Follow Up Recommendations SNF;Supervision/Assistance - 24 hour    Equipment Recommendations  Other (comment)(TBD by next venue of care)    Recommendations for Other Services       Precautions / Restrictions Precautions Precautions: Fall Precaution Comments: Ex-fix on L wrist Restrictions Weight Bearing Restrictions: Yes LUE Weight Bearing: Weight bear through elbow only LLE Weight Bearing: Weight bearing as tolerated      Mobility  Bed Mobility Overal bed mobility: Needs Assistance Bed Mobility: Supine to Sit     Supine to sit: Total assist;+2 for physical assistance;+2 for safety/equipment      General bed mobility comments: +2 assist to advance LEs over EOB and to elevate trunk into sitting; exiting bed to the R  Transfers Overall transfer level: Needs assistance Equipment used: 2 person hand held assist Transfers: Sit to/from [facility] Sit to Stand: Max assist;+2 physical assistance;+2 safety/equipment Stand pivot transfers: Max assist;+2 physical assistance;+2 safety/equipment       General transfer comment: bil knees blocked with +2 assist to rise from EOB and pivot to recliner to the R; use of chuck pad to craddle/support LUE in elevation during transfer; pt able to minimally assist with LEs and use of RUE to push into standing  Ambulation/Gait             General Gait Details: Unable  Stairs            Wheelchair Mobility    Modified Rankin (Stroke Patients Only)       Balance Overall balance assessment: Needs assistance Sitting-balance support: Feet supported;Single extremity supported Sitting balance-Leahy Scale: Poor Sitting balance - Comments: overall ModA for sitting balance; brief periods of minguard-minA with RUE support on EOB; leaning to R to offset wt on L hip Postural control: Right lateral lean;Other (comment)   Standing balance-Leahy Scale: Zero Standing balance comment: MaxA+2 for standing balance                              Pertinent Vitals/Pain Pain Assessment: Faces Faces Pain Scale: Hurts even more Pain Location: L wrist and L  hip Pain Descriptors / Indicators: Operative site guarding;Aching;Sore Pain Intervention(s): Limited activity within patient's tolerance;Monitored during session;Repositioned    Home Living Family/patient expects to be discharged to:: Skilled nursing facility                      Prior Function           Comments: Unsure, pt with decreased short term memory and a poor historian     Hand Dominance   Dominant Hand: Left    Extremity/Trunk Assessment    Upper Extremity Assessment Upper Extremity Assessment: LUE deficits/detail;RUE deficits/detail;Difficult to assess due to impaired cognition RUE Deficits / Details: very thin skin, some skin tears notable and bandaged/covered, generlized weakness  LUE Deficits / Details: ex-fix to L wrist; pt minimally able to move 1st and 2nd digit, passively able to move within available range given bandaging LUE: Unable to fully assess due to pain;Unable to fully assess due to immobilization    Lower Extremity Assessment Lower Extremity Assessment: Defer to PT evaluation LLE Deficits / Details: Acute pain, decreased strength and AROM consistent with above mentioned injury/fixation.  LLE: Unable to fully assess due to pain    Cervical / Trunk Assessment Cervical / Trunk Assessment: Kyphotic  Communication   Communication: HOH  Cognition Arousal/Alertness: Awake/alert Behavior During Therapy: WFL for tasks assessed/performed Overall Cognitive Status: No family/caregiver present to determine baseline cognitive functioning                                 General Comments: Likely baseline. Pt did not remember that she had surgery, or how long she has been in the hospital. Pt reports that she knows she fell but does not remember any details after that; repeating same questions during session      General Comments      Exercises     Assessment/Plan    PT Assessment Patient needs continued PT services  PT Problem List Decreased strength;Decreased range of motion;Decreased activity tolerance;Decreased balance;Decreased mobility;Decreased knowledge of use of DME;Decreased safety awareness;Decreased knowledge of precautions;Pain       PT Treatment Interventions DME instruction;Gait training;Stair training;Functional mobility training;Therapeutic activities;Therapeutic exercise;Neuromuscular re-education;Patient/family education    PT Goals (Current goals can be found in the Care Plan  section)  Acute Rehab PT Goals Patient Stated Goal: less pain; regain independence  PT Goal Formulation: With patient Time For Goal Achievement: 03/10/18 Potential to Achieve Goals: Good    Frequency Min 3X/week   Barriers to discharge        Co-evaluation PT/OT/SLP Co-Evaluation/Treatment: Yes Reason for Co-Treatment: For patient/therapist safety;Complexity of the patient's impairments (multi-system involvement);To address functional/ADL transfers PT goals addressed during session: Mobility/safety with mobility;Balance;Proper use of DME OT goals addressed during session: ADL's and self-care       AM-PAC PT "6 Clicks" Daily Activity  Outcome Measure Difficulty turning over in bed (including adjusting bedclothes, sheets and blankets)?: Unable Difficulty moving from lying on back to sitting on the side of the bed? : Unable Difficulty sitting down on and standing up from a chair with arms (e.g., wheelchair, bedside commode, etc,.)?: Unable Help needed moving to and from a bed to chair (including a wheelchair)?: Total Help needed walking in hospital room?: Total Help needed climbing 3-5 steps with a railing? : Total 6 Click Score: 6    End of Session Equipment Utilized During Treatment: Gait belt;Oxygen Activity Tolerance: Patient tolerated treatment well  Patient left: in chair;with call bell/phone within reach;with chair alarm set;with nursing/sitter in room;with family/visitor present Nurse Communication: Mobility status PT Visit Diagnosis: Unsteadiness on feet (R26.81);History of falling (Z91.81);Pain Pain - Right/Left: Left Pain - part of body: Shoulder;Hip;Hand    Time: 4163-8453 PT Time Calculation (min) (ACUTE ONLY): 62 min   Charges:   PT Evaluation $PT Eval High Complexity: 1 High PT Treatments $Gait Training: 23-37 mins   PT G Codes:        Rolinda Roan, PT, DPT Acute Rehabilitation Services Pager: [phone]   Thelma Comp 02/24/2018, 2:35 PM

## 2018-02-24 NOTE — Care Management (Signed)
This is a no charge note  I was called to see this pt due to new seizure by Dr. Maudie Mercury who is covering the floor remotely.   84 year old lady was admitted yesterday due to fracture of L femoral neck and L distal radius and ulna after fall. She is s/p of surgery for both. Per RN, pt had one episode of seizure at about 9:20 PM, which lasted for about 45 seconds, and resolved spontaneously. When I saw pt on the floor, pt is in postictal status, confused. Does not following commands. No facial droop or slurred speech noted. She has bruise over left temporal area. Pt had CT-head yesterday, which showed acute left anterior frontal scalp hematoma without intracranial hemorrhage or skull fracture. Per nurse, patient had oxygen desaturation during the procedure, but no oxygen desaturation after event resolved.   -will get stat CT-head -seizure precaution -When necessary Ativan -Communicated with Dr. Maudie Mercury, who will follow-up CT scan and consult neurology if needed.   Ivor Costa, MD  [facility] Pager [phone]  If 7PM-7AM, please contact night-coverage [email or web address] Password [facility] 02/24/2018, 9:42 PM

## 2018-02-24 NOTE — Plan of Care (Signed)

## 2018-02-24 NOTE — Evaluation (Addendum)
Occupational Therapy Evaluation Patient Details Name: Rebecca Petty MRN: [id number] DOB: [birth year] Today's Date: 02/24/2018    History of Present Illness Pt is an 84 y/o female who presents s/p fall, sustaining an open grade 3 left distal radius and ulna fracture, L femoral neck shaft fracture, and shoulder skin tear measuring 5x5 cm. Pt is now s/p external fixator to the L distal radius, repair of degloving skin injury to the L shoulder, and closed reduction with percutaneous pin fixation of the L femoral neck. Pt is currently NWB to the L wrist, WBAT through the L elbow, and WBAT through the LLE. PMH significant for thyroid disease, HTN.   Clinical Impression   This 84 y/o female presents with the above. Pt reports she lives alone, completing ADLs with independence and reports using [facility] for functional mobility PTA. Pt requiring totalA+2 for bed mobility, MaxA+2 for stand pivot transfers this session. Currently requiring totalA for LB ADLs, modA for UB ADLs and minA for grooming/self-feeding ADLs. Pt demonstrating increased pain, generalized weakness and decreased functional performance. Prior to transfer pt sitting EOB and requiring ModA for static sitting balance, intermittently able to maintain with minA-minguard. Pt demonstrating cognitive deficits, often repeating questions and disoriented to recent events. Will benefit from continued acute OT services and recommend SNF level therapy services after discharge to maximize her safety and independence with ADLs and mobility.     Follow Up Recommendations  SNF;Supervision/Assistance - 24 hour    Equipment Recommendations  Other (comment)(TBD in next venue )           Precautions / Restrictions Precautions Precautions: Fall Precaution Comments: Ex-fix on L wrist Restrictions Weight Bearing Restrictions: Yes LUE Weight Bearing: Weight bear through elbow only LLE Weight Bearing: Weight bearing as tolerated      Mobility Bed  Mobility Overal bed mobility: Needs Assistance Bed Mobility: Supine to Sit     Supine to sit: Total assist;+2 for physical assistance;+2 for safety/equipment     General bed mobility comments: +2 assist to advance LEs over EOB and to elevate trunk into sitting; exiting bed to the R  Transfers Overall transfer level: Needs assistance Equipment used: 2 person hand held assist Transfers: Sit to/from [facility] Sit to Stand: Max assist;+2 physical assistance;+2 safety/equipment Stand pivot transfers: Max assist;+2 physical assistance;+2 safety/equipment       General transfer comment: bil knees blocked with +2 assist to rise from EOB and pivot to recliner to the R; use of chuck pad to craddle/support LUE in elevation during transfer; pt able to minimally assist with LEs and use of RUE to push into standing    Balance Overall balance assessment: Needs assistance Sitting-balance support: Feet supported;Single extremity supported Sitting balance-Leahy Scale: Poor Sitting balance - Comments: overall ModA for sitting balance; brief periods of minguard-minA with RUE support on EOB; leaning to R to offset wt on L hip Postural control: Right lateral lean;Other (comment)   Standing balance-Leahy Scale: Zero Standing balance comment: MaxA+2 for standing balance                            ADL either performed or assessed with clinical judgement   ADL Overall ADL's : Needs assistance/impaired Eating/Feeding: Set up;Minimal assistance;Sitting Eating/Feeding Details (indicate cue type and reason): intermittently requiring hand over hand assist to bring cup to mouth due to shakiness Grooming: Wash/dry face;Set up;Sitting;Moderate assistance Grooming Details (indicate cue type and reason): ModA for sitting balance  Upper Body Bathing: Moderate assistance;Sitting   Lower Body Bathing: Maximal assistance;+2 for physical assistance;+2 for safety/equipment;Sitting/lateral  leans;Bed level   Upper Body Dressing : Maximal assistance;Sitting   Lower Body Dressing: Total assistance;Bed level;Sit to/from stand;+2 for physical assistance;+2 for safety/equipment   Toilet Transfer: Maximal assistance;Stand-pivot;+2 for physical assistance;+2 for safety/equipment Toilet Transfer Details (indicate cue type and reason): simulated in transfer to Cable and Hygiene: Total assistance;+2 for physical assistance;+2 for safety/equipment;Sit to/from stand       Functional mobility during ADLs: Maximal assistance;+2 for physical assistance;+2 for safety/equipment(for stand pivot only) General ADL Comments: pt requiring increased time for all tasks; sat EOB approx 7-8 min during session though requiring overall modA to maintain sitting balance; MaxA+2 for transfer; assisted nursing with changing bandaging aroung L wrist as pt's bandage seeping through with drainage                   Pertinent Vitals/Pain Pain Assessment: Faces Faces Pain Scale: Hurts even more Pain Location: L wrist and L hip Pain Descriptors / Indicators: Operative site guarding;Aching;Sore Pain Intervention(s): Limited activity within patient's tolerance;Monitored during session;Repositioned     Hand Dominance Left   Extremity/Trunk Assessment Upper Extremity Assessment Upper Extremity Assessment: LUE deficits/detail;RUE deficits/detail;Difficult to assess due to impaired cognition RUE Deficits / Details: very thin skin, some skin tears notable and bandaged/covered, generlized weakness  LUE Deficits / Details: ex-fix to L wrist; pt minimally able to move 1st and 2nd digit, passively able to move within available range given bandaging LUE: Unable to fully assess due to pain;Unable to fully assess due to immobilization   Lower Extremity Assessment Lower Extremity Assessment: Defer to PT evaluation       Communication Communication Communication: HOH    Cognition Arousal/Alertness: Awake/alert Behavior During Therapy: WFL for tasks assessed/performed Overall Cognitive Status: No family/caregiver present to determine baseline cognitive functioning                                 General Comments: Likely baseline. Pt did not remember that she had surgery, or how long she has been in the hospital. Pt reports that she knows she fell but does not remember any details after that; repeating same questions during session   General Comments                  Home Living Family/patient expects to be discharged to:: Skilled nursing facility                                                  Comments: Unsure, pt with decreased short term memory and a poor historian        OT Problem List: Decreased strength;Impaired balance (sitting and/or standing);Decreased cognition;Pain;Decreased range of motion;Decreased activity tolerance;Impaired UE functional use      OT Treatment/Interventions: Self-care/ADL training;DME and/or AE instruction;Therapeutic activities;Balance training;Therapeutic exercise;Patient/family education    OT Goals(Current goals can be found in the care plan section) Acute Rehab OT Goals Patient Stated Goal: less pain; regain independence  OT Goal Formulation: With patient/family Time For Goal Achievement: 03/10/18 Potential to Achieve Goals: Good  OT Frequency: Min 3X/week               Co-evaluation PT/OT/SLP Co-Evaluation/Treatment: Yes Reason for Co-Treatment: For  patient/therapist safety;Complexity of the patient's impairments (multi-system involvement);To address functional/ADL transfers PT goals addressed during session: Mobility/safety with mobility;Balance;Proper use of DME OT goals addressed during session: ADL's and self-care      AM-PAC PT "6 Clicks" Daily Activity     Outcome Measure Help from another person eating meals?: A Little Help from another person taking  care of personal grooming?: A Little Help from another person toileting, which includes using toliet, bedpan, or urinal?: Total Help from another person bathing (including washing, rinsing, drying)?: A Lot Help from another person to put on and taking off regular upper body clothing?: A Lot Help from another person to put on and taking off regular lower body clothing?: Total 6 Click Score: 12   End of Session Equipment Utilized During Treatment: Gait belt;Other (comment)(L wrist ex-fix ) Nurse Communication: Mobility status;Other (comment)(needs dressing changed )  Activity Tolerance: Patient tolerated treatment well Patient left: in chair;with call bell/phone within reach;with family/visitor present  OT Visit Diagnosis: Other abnormalities of gait and mobility (R26.89);Muscle weakness (generalized) (M62.81);History of falling (Z91.81);Pain Pain - Right/Left: Left Pain - part of body: Hand;Hip                Time: 8563-1497 OT Time Calculation (min): 62 min Charges:  OT General Charges $OT Visit: 1 Visit OT Evaluation $OT Eval Moderate Complexity: 1 Mod OT Treatments $Self Care/Home Management : 8-22 mins G-Codes:     Lou Cal, OT Pager [phone] 02/24/2018   Raymondo Band 02/24/2018, 2:00 PM

## 2018-02-24 NOTE — Progress Notes (Addendum)
Patient ID: Rebecca Petty, female   DOB: 1926/02/10, 84 y.o.   MRN: [id number]     Subjective:  Patient reports pain as mild to moderate.  Patient in bed and follows commands in no acute distress  Objective:   VITALS:   Vitals:   02/24/18 0516 02/24/18 0738 02/24/18 0741 02/24/18 1406  BP: 131/72   111/67  Pulse: 76   75  Resp: 18   18  Temp: 99.2 F (37.3 C)   98.2 F (36.8 C)  TempSrc: Oral   Oral  SpO2: 100% 99% 99% 100%  Weight:      Height:        ABD soft Sensation intact distally Dorsiflexion/Plantar flexion intact Incision: moderate drainage Dressing changed due to bleeding this AM  Lab Results  Component Value Date   WBC 8.5 02/24/2018   HGB 10.6 (L) 02/24/2018   HCT 31.3 (L) 02/24/2018   MCV 92.6 02/24/2018   PLT 160 02/24/2018   BMET    Component Value Date/Time   NA 138 02/24/2018 0646   K 3.9 02/24/2018 0646   CL 108 02/24/2018 0646   CO2 21 (L) 02/24/2018 0646   GLUCOSE 151 (H) 02/24/2018 0646   BUN 16 02/24/2018 0646   CREATININE 0.97 02/24/2018 0646   CALCIUM 7.9 (L) 02/24/2018 0646   GFRNONAA 50 (L) 02/24/2018 0646   GFRAA 58 (L) 02/24/2018 0646     Assessment/Plan: 1 Day Post-Op   Principal Problem:   Fracture of femoral neck, left (HCC) Active Problems:   Fracture of distal radius and ulna, left, open type III, initial encounter   Hypokalemia   Femoral fracture (HCC)   Advance diet Up with therapy Continue plan per medicine WBAT left lower NWB left upper Dry dressing PRN Plan dressing change on Sat it does not need it before     Remonia Richter 02/24/2018, 5:25 PM  Discussed and agree with above.  Ok to wb with platform walker, but not through the hand/wrist. Marchia Bond, MD Cell [phone]

## 2018-02-24 NOTE — Progress Notes (Signed)
Called into patients room by daughter d/t patient not responding. Upon entering patients room, patient was actively having a seizure. Bilateral upper extremities pulled into chest, eyes deviated to left and teeth clinched during seizure. Airway was protected and patient kept free from injury during seizure. Oral suctioning provided. Oxygen saturation decreased to 77% during seizure, but returned to 99% after seizure. Called rapid response team, charge nurse, and hospitalist paged. New orders placed. Will continue to monitor and treat patient following MD orders. Family at bedside and updated on plan of care.

## 2018-02-24 NOTE — Progress Notes (Addendum)
PROGRESS NOTE  Rebecca Petty  NWG:[id number] DOB: [birth year] DOA: 02/23/2018 PCP: No primary care provider on file.   Brief Narrative: Rebecca Petty is an 84 y.o. female with a history of HTN, hypothyroidism, and GERD who tripped and fell onto concrete outside [facility], with resultant trauma to the left side of the body including open distal radius and ulna fractures, left femoral neck fracture, and left shoulder degloving injury as well as left facial/head contusion. She was brought to the ED and urgently taken for surgery 4/17 by Dr. Mardelle Matte. This witnessed fall was not accompanied by loss of consciousness.   Assessment & Plan: Principal Problem:   Fracture of femoral neck, left (HCC) Active Problems:   Fracture of distal radius and ulna, left, open type III, initial encounter   Hypokalemia   Femoral fracture (HCC)  Mechanical fall with multiple trauma: No LOC/syncope. CT head showed atrophy, extensive microvascular ischemic changes and left anterior scalp hematoma without fracture/ICH.  - Management as below  Left femoral neck fracture: s/p closed reduction and percutaneous pin fixation.  - Per orthopedics, WBAT, pain control as ordered - PT/OT, CSW consulted as SNF placement is anticipated  Left open grade 3 distal radius and ulna fractures s/p open reduction and external fixation - Give 3 days abx, TDaP given - NWB  Acute blood loss anemia post-operatively:  - Monitor CBC  Hypothyroidism: No clinical evidence of hypo/hyperthyroidism.  - On stable dose synthroid which will be continued.   HTN: At goal.  - continue norvasc  Hypokalemia:  - Resolved with replacement. Continue IV fluids today as she has not taken much by mouth yet.  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 No chest pain.  - Continue telemetry monitoring.   DVT prophylaxis: Loveonx Code Status: Full Family Communication: Daughters at  bedside Disposition Plan: SNF vs. CIR pending therapy evaluations  Consultants:   Orthopedics, Dr. Mardelle Matte  Procedures:  PROCEDURE 02/23/2018:  SURGEON:  Johnny Bridge, MD  1.  Left distal radius and ulna open irrigation, debridement, skin, subcutaneous tissue, and bone 2.  Application of external fixator to the left distal radius going from metacarpal to radial shaft 3.  Complex wound closure, 15 cm open wound on the ulnar side of the distal forearm 4.  Repair of degloving skin injury to the left shoulder, 5 x 5 cm 5.  Closed reduction with percutaneous pin fixation left femoral neck  Antimicrobials:  Ancef 4/17 x3 days  Subjective: Pain is controlled. Pt is left handed and thus significantly debilitated by injuries. Has not eaten yet, but is getting lunch tray now. No chest pain, dyspnea, syncope. Worked with PT this AM.  Objective: Vitals:   02/24/18 0010 02/24/18 0516 02/24/18 0738 02/24/18 0741  BP: 120/82 131/72    Pulse: 71 76    Resp: 18 18    Temp:  99.2 F (37.3 C)    TempSrc:  Oral    SpO2: 98% 100% 99% 99%  Weight:      Height:        Intake/Output Summary (Last 24 hours) at 02/24/2018 1312 Last data filed at 02/24/2018 0600 Gross per 24 hour  Intake 600 ml  Output 700 ml  Net -100 ml   Filed Weights   02/23/18 1327  Weight: 54.4 kg (120 lb)    Gen: Pleasant, frail elderly female in no distress Pulm: Non-labored breathing. Clear to auscultation bilaterally.  CV: Regular rate and rhythm. No murmur, rub, or gallop. No JVD, no pedal edema. GI: Abdomen soft, non-tender, non-distended, with normoactive bowel sounds. No organomegaly or masses felt. Ext: Left arm externally fixated with cast and pins. Fingers with +AROM, sensation intact to light touch with somewhat impaired discrimination. RUE no deformity. LLE with dressing on hip c/d/i, soft compartment, toes with AROM, SILT, DP pulses 2+, symmetric.  Skin: Extensive ecchymoses on upper extremities  without focal fluctuance/hematoma. Left scalp hematoma without crepitus. Left shoulder dressing c/d/i, not taken down today. Neuro: Alert and oriented. No focal neurological deficits. Psych: Judgement and insight appear normal. Mood & affect appropriate.   Data Reviewed: I have personally reviewed following labs and imaging studies  CBC: Recent Labs  Lab 02/23/18 1416 02/23/18 1434 02/24/18 1022  WBC 11.5*  --  8.5  HGB 14.2 13.9 10.6*  HCT 41.4 41.0 31.3*  MCV 91.8  --  92.6  PLT 166  --  315   Basic Metabolic Panel: Recent Labs  Lab 02/23/18 1416 02/23/18 1434 02/24/18 0646  NA 140 141 138  K 2.9* 2.9* 3.9  CL 109 106 108  CO2 20*  --  21*  GLUCOSE 144* 143* 151*  BUN 11 13 16   CREATININE 0.70 0.70 0.97  CALCIUM 8.3*  --  7.9*   GFR: Estimated Creatinine Clearance: 33.8 mL/min (by C-G formula based on SCr of 0.97 mg/dL). Liver Function Tests: Recent Labs  Lab 02/23/18 1416 02/24/18 0646  AST 21 26  ALT 16 13*  ALKPHOS 88 67  BILITOT 1.0 0.8  PROT 6.1* 4.9*  ALBUMIN 3.7 2.9*   No results for input(s): LIPASE, AMYLASE in the last 168 hours. No results for input(s): AMMONIA in the last 168 hours. Coagulation Profile: Recent Labs  Lab 02/23/18 1416  INR 1.03   Cardiac Enzymes: No results for input(s): CKTOTAL, CKMB, CKMBINDEX, TROPONINI in the last 168 hours. BNP (last 3 results) No results for input(s): PROBNP in the last 8760 hours. HbA1C: No results for input(s): HGBA1C in the last 72 hours. CBG: No results for input(s): GLUCAP in the last 168 hours. Lipid Profile: No results for input(s): CHOL, HDL, LDLCALC, TRIG, CHOLHDL, LDLDIRECT in the last 72 hours. Thyroid Function Tests: No results for input(s): TSH, T4TOTAL, FREET4, T3FREE, THYROIDAB in the last 72 hours. Anemia Panel: No results for input(s): VITAMINB12, FOLATE, FERRITIN, TIBC, IRON, RETICCTPCT in the last 72 hours. Urine analysis:    Component Value Date/Time   COLORURINE YELLOW  02/23/2018 1510   APPEARANCEUR HAZY (A) 02/23/2018 1510   LABSPEC 1.009 02/23/2018 1510   PHURINE 6.0 02/23/2018 1510   GLUCOSEU NEGATIVE 02/23/2018 1510   HGBUR MODERATE (A) 02/23/2018 1510   BILIRUBINUR NEGATIVE 02/23/2018 1510   KETONESUR 5 (A) 02/23/2018 1510   PROTEINUR NEGATIVE 02/23/2018 1510   NITRITE NEGATIVE 02/23/2018 1510   LEUKOCYTESUR MODERATE (A) 02/23/2018 1510   No results found for this or any previous visit (from the past 240 hour(s)).    Radiology Studies: Dg Elbow 2 Views Left  Result Date: 02/23/2018 CLINICAL DATA:  Fall EXAM: LEFT ELBOW - 2 VIEW COMPARISON:  None. FINDINGS: There is no evidence of fracture, dislocation, or joint effusion. There is no evidence of arthropathy or other focal bone abnormality. Soft tissues are unremarkable. IMPRESSION: Negative. Electronically Signed   By: Franchot Gallo M.D.   On: 02/23/2018 17:27   Dg Wrist 2 Views Left  Result Date: 02/23/2018 CLINICAL DATA:  Fall EXAM: LEFT WRIST - 2  VIEW COMPARISON:  None. FINDINGS: Image quality degraded by plaster splint. Comminuted fracture distal radius with radiocarpal dislocation. Suboptimal anatomical evaluation, consider CT for further evaluation. Questionable fracture distal ulna. IMPRESSION: Fracture dislocation of the wrist. Recommend CT for better anatomical evaluation. Electronically Signed   By: Franchot Gallo M.D.   On: 02/23/2018 17:26   Dg Wrist Complete Left  Result Date: 02/23/2018 CLINICAL DATA:  Distal radius fracture, external fixation EXAM: LEFT WRIST - COMPLETE 3+ VIEW; DG C-ARM 61-120 MIN COMPARISON:  None. FINDINGS: Spot fluoroscopic views during external fixation of left wrist complex comminuted intra-articular fracture. There is improved alignment at the wrist joint with residual displacement of the distal radius fragments. Bones are osteopenic. IMPRESSION: Status post external fixation of the left wrist fracture dislocation with improved alignment. Electronically Signed    By: Jerilynn Mages.  Shick M.D.   On: 02/23/2018 21:36   Ct Head Wo Contrast  Result Date: 02/23/2018 CLINICAL DATA:  Fall, head injury, left frontal scalp hematoma EXAM: CT HEAD WITHOUT CONTRAST TECHNIQUE: Contiguous axial images were obtained from the base of the skull through the vertex without intravenous contrast. COMPARISON:  02/08/2017 FINDINGS: Brain: Age related brain atrophy and extensive chronic white matter microvascular ischemic changes throughout both cerebral hemispheres. No acute intracranial hemorrhage, mass lesion, new infarction, midline shift, herniation, hydrocephalus, or extra-axial fluid collection. Cisterns are patent. Cerebellar atrophy as well. Vascular: Intracranial atherosclerosis noted.  No hyperdense vessel. Skull: Left frontal scalp acute soft tissue hematoma. No underlying skull fracture. Sinuses/Orbits: Sinuses and mastoids are clear.  Symmetric orbits. Other: None. IMPRESSION: Atrophy and extensive chronic white matter microvascular ischemic changes. Acute left anterior frontal scalp hematoma without intracranial hemorrhage or skull fracture. Electronically Signed   By: Jerilynn Mages.  Shick M.D.   On: 02/23/2018 17:00   Pelvis Portable  Result Date: 02/23/2018 CLINICAL DATA:  Status post internal fixation of left hip fracture. EXAM: PORTABLE PELVIS 1-2 VIEWS COMPARISON:  Pelvic radiograph performed earlier today at 1:51 p.m. FINDINGS: No new fractures are seen. Pins are noted transfixing the patient's left femoral fracture in near anatomic alignment. Both femoral heads are seated normally within their respective acetabula. The sacroiliac joints are unremarkable in appearance. The visualized bowel gas pattern is grossly unremarkable in appearance. Scattered phleboliths are noted within the pelvis. IMPRESSION: Status post internal fixation of left femoral fracture in near anatomic alignment. Electronically Signed   By: Garald Balding M.D.   On: 02/23/2018 23:57   Dg Pelvis Portable  Result Date:  02/23/2018 CLINICAL DATA:  Fall today EXAM: PORTABLE PELVIS 1-2 VIEWS COMPARISON:  02/08/2017 FINDINGS: There is no evidence of pelvic fracture or diastasis. No pelvic bone lesions are seen. Atherosclerotic calcification. IMPRESSION: Negative. Electronically Signed   By: Franchot Gallo M.D.   On: 02/23/2018 14:23   Dg Chest Port 1 View  Result Date: 02/23/2018 CLINICAL DATA:  Fall today EXAM: PORTABLE CHEST 1 VIEW COMPARISON:  06/11/2017 FINDINGS: Heart size within normal limits. Negative for heart failure. Atherosclerotic aortic arch. Lungs are clear. Apical scarring left greater than right IMPRESSION: No active disease. Electronically Signed   By: Franchot Gallo M.D.   On: 02/23/2018 14:22   Dg Shoulder Left Port  Result Date: 02/23/2018 CLINICAL DATA:  Fall EXAM: LEFT SHOULDER - 1 VIEW COMPARISON:  None. FINDINGS: There is no evidence of fracture or dislocation. There is no evidence of arthropathy or other focal bone abnormality. Soft tissues are unremarkable. IMPRESSION: Negative. Electronically Signed   By: Franchot Gallo M.D.   On:  02/23/2018 17:24   Dg C-arm 1-60 Min  Result Date: 02/23/2018 CLINICAL DATA:  Internal fixation hip fracture EXAM: OPERATIVE LEFT HIP (WITH PELVIS IF PERFORMED) multiple VIEWS TECHNIQUE: Fluoroscopic spot image(s) were submitted for interpretation post-operatively. COMPARISON:  None. FINDINGS: Multiple spot radiographs demonstrate 3 cannulated screws spanning the LEFT femoral neck. No fracture or dislocation. IMPRESSION: No complication following internal fixation LEFT femoral neck fracture. Electronically Signed   By: Suzy Bouchard M.D.   On: 02/23/2018 22:56   Dg C-arm 1-60 Min  Result Date: 02/23/2018 CLINICAL DATA:  Distal radius fracture, external fixation EXAM: LEFT WRIST - COMPLETE 3+ VIEW; DG C-ARM 61-120 MIN COMPARISON:  None. FINDINGS: Spot fluoroscopic views during external fixation of left wrist complex comminuted intra-articular fracture. There is  improved alignment at the wrist joint with residual displacement of the distal radius fragments. Bones are osteopenic. IMPRESSION: Status post external fixation of the left wrist fracture dislocation with improved alignment. Electronically Signed   By: Jerilynn Mages.  Shick M.D.   On: 02/23/2018 21:36   Dg Hip Operative Unilat With Pelvis Left  Result Date: 02/23/2018 CLINICAL DATA:  Internal fixation hip fracture EXAM: OPERATIVE LEFT HIP (WITH PELVIS IF PERFORMED) multiple VIEWS TECHNIQUE: Fluoroscopic spot image(s) were submitted for interpretation post-operatively. COMPARISON:  None. FINDINGS: Multiple spot radiographs demonstrate 3 cannulated screws spanning the LEFT femoral neck. No fracture or dislocation. IMPRESSION: No complication following internal fixation LEFT femoral neck fracture. Electronically Signed   By: Suzy Bouchard M.D.   On: 02/23/2018 22:56   Dg Hip Unilat W Or Wo Pelvis 2-3 Views Left  Result Date: 02/23/2018 CLINICAL DATA:  Left hip pain and stiffness EXAM: DG HIP (WITH OR WITHOUT PELVIS) 2-3V LEFT COMPARISON:  02/08/2017 FINDINGS: There is cortical irregularity of the proximal left hip surgical neck with mild foreshortening suspicious for a impacted subcapital femoral neck fracture. Bones are osteopenic. Visualized left hemipelvis intact. Peripheral atherosclerosis noted. IMPRESSION: Findings suspicious for a subtle proximal left hip subcapital femoral neck impacted fracture. Osteopenia Atherosclerosis Electronically Signed   By: Jerilynn Mages.  Shick M.D.   On: 02/23/2018 17:44    Scheduled Meds: . acetaminophen  500 mg Oral Q6H  . amLODipine  5 mg Oral Daily  . budesonide  0.25 mg Nebulization BID  . docusate sodium  100 mg Oral BID  . enoxaparin (LOVENOX) injection  30 mg Subcutaneous Q24H  . ferrous sulfate  325 mg Oral TID PC  . levothyroxine  88 mcg Oral QAC breakfast  . pantoprazole  40 mg Oral Daily  . tiotropium  18 mcg Inhalation Daily   Continuous Infusions: . sodium chloride     . 0.9 % NaCl with KCl 20 mEq / L 75 mL/hr at 02/24/18 0135  .  ceFAZolin (ANCEF) IV Stopped (02/24/18 0655)     LOS: 1 day   Time spent: 25 minutes.  Vance Gather, MD [facility] Pager [phone]  If 7PM-7AM, please contact night-coverage [email or web address] Password [email or web address] 02/24/2018, 1:12 PM

## 2018-02-25 ENCOUNTER — Inpatient Hospital Stay (HOSPITAL_COMMUNITY): Payer: Medicare Other

## 2018-02-25 DIAGNOSIS — R569 Unspecified convulsions: Secondary | ICD-10-CM

## 2018-02-25 LAB — CBC
HCT: 23.6 % — ABNORMAL LOW (ref 36.0–46.0)
Hemoglobin: 8.1 g/dL — ABNORMAL LOW (ref 12.0–15.0)
MCH: 32.1 pg (ref 26.0–34.0)
MCHC: 34.3 g/dL (ref 30.0–36.0)
MCV: 93.7 fL (ref 78.0–100.0)
PLATELETS: 85 10*3/uL — AB (ref 150–400)
RBC: 2.52 MIL/uL — ABNORMAL LOW (ref 3.87–5.11)
RDW: 13 % (ref 11.5–15.5)
WBC: 5.3 10*3/uL (ref 4.0–10.5)

## 2018-02-25 LAB — BASIC METABOLIC PANEL
Anion gap: 8 (ref 5–15)
BUN: 12 mg/dL (ref 6–20)
CO2: 21 mmol/L — ABNORMAL LOW (ref 22–32)
CREATININE: 0.71 mg/dL (ref 0.44–1.00)
Calcium: 7.7 mg/dL — ABNORMAL LOW (ref 8.9–10.3)
Chloride: 108 mmol/L (ref 101–111)
GFR calc Af Amer: >60 mL/min (ref >60)
Glucose, Bld: 128 mg/dL — ABNORMAL HIGH (ref 65–99)
Potassium: 3.6 mmol/L (ref 3.5–5.1)
SODIUM: 137 mmol/L (ref 135–145)

## 2018-02-25 MED ORDER — LEVETIRACETAM IN NACL 500 MG/100ML IV SOLN
500.0000 mg | Freq: Two times a day (BID) | INTRAVENOUS | Status: DC
  Administered 2018-02-25 – 2018-02-27 (×5): 500 mg via INTRAVENOUS
  Filled 2018-02-25 (×6): qty 100

## 2018-02-25 NOTE — Progress Notes (Signed)
PROGRESS NOTE  [name]  OJJ:[id number] DOB: 12/22/1925 DOA: 02/23/2018 PCP: No primary care provider on file.   Brief Narrative: Rebecca Petty is an 84 y.o. female with a history of HTN, hypothyroidism, and GERD who tripped and fell onto concrete outside [facility], with resultant trauma to the left side of the body including open distal radius and ulna fractures, left femoral neck fracture, and left shoulder degloving injury as well as left facial/head contusion. She was brought to the ED and urgently taken for surgery 4/17 by Dr. Mardelle Matte. This witnessed fall was not accompanied by loss of consciousness. On 4/18 she had a seizure witnessed by her daughter lasting ~45 seconds. Neurology consulted, keppra started, MRI ordered but precluded by hardware. EEG is pending.   Assessment & Plan: Principal Problem:   Fracture of femoral neck, left (HCC) Active Problems:   Fracture of distal radius and ulna, left, open type III, initial encounter   Hypokalemia   Femoral fracture (HCC)  Mechanical fall with multiple trauma: No LOC/syncope. CT head showed atrophy, extensive microvascular ischemic changes and left anterior scalp hematoma without fracture/ICH.  - Management as below  Left femoral neck fracture: s/p closed reduction and percutaneous pin fixation.  - Per orthopedics, WBAT LLE, pain control as ordered - PT/OT, CSW consulted for SNF placement. FL2 signed.   Left open grade 3 distal radius and ulna fractures s/p open reduction and external fixation - Give 3 days abx, TDaP given - NWB LUE  Acute blood loss anemia post-operatively: Also with significant ecchymoses and now thrombocytopenia (though all cell lines have dropped).  - Will continue to monitor CBC. Due to significant drop, will get T&S with AM labs. No evidence of hypovolemic/hemorrhagic shock/hypotension. Transfusion threshold is 7g/dl.   Seizure: 4/18. Spontaneously resolved in 45 seconds.  - EEG pending - Neurology consult  appreciated - Seizure precautions - No MRI per orthopedics due to hardware.   Hypothyroidism: No clinical evidence of hypo/hyperthyroidism.  - On stable dose synthroid which will be continued.   HTN: At goal.  - Continue norvasc  Hypokalemia:  - Resolved with replacement. 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 No chest pain.  - Continue telemetry monitoring.   DVT prophylaxis: Loveonx Code Status: Full Family Communication: Daughters at bedside Disposition Plan: SNF  Consultants:   Orthopedics, Dr. Mardelle Matte  Procedures:  PROCEDURE 02/23/2018:  SURGEON:  Johnny Bridge, MD  1.  Left distal radius and ulna open irrigation, debridement, skin, subcutaneous tissue, and bone 2.  Application of external fixator to the left distal radius going from metacarpal to radial shaft 3.  Complex wound closure, 15 cm open wound on the ulnar side of the distal forearm 4.  Repair of degloving skin injury to the left shoulder, 5 x 5 cm 5.  Closed reduction with percutaneous pin fixation left femoral neck  Antimicrobials:  Ancef 4/17 x3 days  Subjective: Had seizure last night but none since. Was given sedating medications but has returned to baseline mental status per my previous visit and per family at bedside.  Denies dyspnea, chest pain, palpitations, lightheadedness, GI or vaginal bleeding.   Objective: Vitals:   02/24/18 2136 02/25/18 0431 02/25/18 0447 02/25/18 1145  BP: (!) 158/81 (!) 152/103 (!) 181/94   Pulse: (!) 103 78    Resp:      Temp:  98.4  F (36.9 C)    TempSrc:  Axillary    SpO2:   95% 96%  Weight:      Height:       No intake or output data in the 24 hours ending 02/25/18 1352 Filed Weights   02/23/18 1327  Weight: 54.4 kg (120 lb)    Gen: Pleasant, frail elderly female in no distress Pulm: Non-labored breathing. Clear to auscultation  bilaterally.  CV: Regular rate and rhythm. No murmur, rub, or gallop. No JVD, no pedal edema. GI: Abdomen soft, non-tender, non-distended, with normoactive bowel sounds. No organomegaly or masses felt. Ext: Left arm externally fixated with cast and pins. Fingers with +AROM, SILT. RUE no deformity. LLE with dressing on hip c/d/i, soft compartment, toes with AROM, SILT, DP pulses 2+, symmetric.  Skin: Extensive ecchymoses on upper extremities without focal fluctuance/hematoma This appears grossly stable from yesterdays exam. Left scalp hematoma without crepitus, also stable. Left shoulder dressing c/d/i, not taken down, no extension of ecchymosis, dressing c/d/i.  Neuro: Alert and oriented but very forgetful and perseverates on certain facts. No focal neurological deficits. Psych: Judgement and insight appear fair. Mood & affect appropriate.   Data Reviewed: I have personally reviewed following labs and imaging studies  CBC: Recent Labs  Lab 02/23/18 1416 02/23/18 1434 02/24/18 1022 02/25/18 0948  WBC 11.5*  --  8.5 5.3  HGB 14.2 13.9 10.6* 8.1*  HCT 41.4 41.0 31.3* 23.6*  MCV 91.8  --  92.6 93.7  PLT 166  --  160 85*   Basic Metabolic Panel: Recent Labs  Lab 02/23/18 1416 02/23/18 1434 02/24/18 0646 02/25/18 0641  NA 140 141 138 137  K 2.9* 2.9* 3.9 3.6  CL 109 106 108 108  CO2 20*  --  21* 21*  GLUCOSE 144* 143* 151* 128*  BUN 11 13 16 12   CREATININE 0.70 0.70 0.97 0.71  CALCIUM 8.3*  --  7.9* 7.7*   GFR: Estimated Creatinine Clearance: 40.9 mL/min (by C-G formula based on SCr of 0.71 mg/dL). Liver Function Tests: Recent Labs  Lab 02/23/18 1416 02/24/18 0646  AST 21 26  ALT 16 13*  ALKPHOS 88 67  BILITOT 1.0 0.8  PROT 6.1* 4.9*  ALBUMIN 3.7 2.9*   No results for input(s): LIPASE, AMYLASE in the last 168 hours. No results for input(s): AMMONIA in the last 168 hours. Coagulation Profile: Recent Labs  Lab 02/23/18 1416  INR 1.03   Cardiac Enzymes: No results  for input(s): CKTOTAL, CKMB, CKMBINDEX, TROPONINI in the last 168 hours. BNP (last 3 results) No results for input(s): PROBNP in the last 8760 hours. HbA1C: No results for input(s): HGBA1C in the last 72 hours. CBG: Recent Labs  Lab 02/24/18 2134  GLUCAP 158*   Lipid Profile: No results for input(s): CHOL, HDL, LDLCALC, TRIG, CHOLHDL, LDLDIRECT in the last 72 hours. Thyroid Function Tests: No results for input(s): TSH, T4TOTAL, FREET4, T3FREE, THYROIDAB in the last 72 hours. Anemia Panel: No results for input(s): VITAMINB12, FOLATE, FERRITIN, TIBC, IRON, RETICCTPCT in the last 72 hours. Urine analysis:    Component Value Date/Time   COLORURINE YELLOW 02/23/2018 1510   APPEARANCEUR HAZY (A) 02/23/2018 1510   LABSPEC 1.009 02/23/2018 1510   PHURINE 6.0 02/23/2018 1510   GLUCOSEU NEGATIVE 02/23/2018 1510   HGBUR MODERATE (A) 02/23/2018 1510   BILIRUBINUR NEGATIVE 02/23/2018 1510   KETONESUR 5 (A) 02/23/2018 1510   PROTEINUR NEGATIVE 02/23/2018 1510   NITRITE NEGATIVE 02/23/2018 1510   LEUKOCYTESUR MODERATE (A)  02/23/2018 1510   No results found for this or any previous visit (from the past 240 hour(s)).    Radiology Studies: Dg Elbow 2 Views Left  Result Date: 02/23/2018 CLINICAL DATA:  Fall EXAM: LEFT ELBOW - 2 VIEW COMPARISON:  None. FINDINGS: There is no evidence of fracture, dislocation, or joint effusion. There is no evidence of arthropathy or other focal bone abnormality. Soft tissues are unremarkable. IMPRESSION: Negative. Electronically Signed   By: Franchot Gallo M.D.   On: 02/23/2018 17:27   Dg Wrist 2 Views Left  Result Date: 02/23/2018 CLINICAL DATA:  Fall EXAM: LEFT WRIST - 2 VIEW COMPARISON:  None. FINDINGS: Image quality degraded by plaster splint. Comminuted fracture distal radius with radiocarpal dislocation. Suboptimal anatomical evaluation, consider CT for further evaluation. Questionable fracture distal ulna. IMPRESSION: Fracture dislocation of the wrist.  Recommend CT for better anatomical evaluation. Electronically Signed   By: Franchot Gallo M.D.   On: 02/23/2018 17:26   Dg Wrist Complete Left  Result Date: 02/23/2018 CLINICAL DATA:  Distal radius fracture, external fixation EXAM: LEFT WRIST - COMPLETE 3+ VIEW; DG C-ARM 61-120 MIN COMPARISON:  None. FINDINGS: Spot fluoroscopic views during external fixation of left wrist complex comminuted intra-articular fracture. There is improved alignment at the wrist joint with residual displacement of the distal radius fragments. Bones are osteopenic. IMPRESSION: Status post external fixation of the left wrist fracture dislocation with improved alignment. Electronically Signed   By: Jerilynn Mages.  Shick M.D.   On: 02/23/2018 21:36   Ct Head Wo Contrast  Result Date: 02/24/2018 CLINICAL DATA:  New onset seizure.  History of hypertension. EXAM: CT HEAD WITHOUT CONTRAST TECHNIQUE: Contiguous axial images were obtained from the base of the skull through the vertex without intravenous contrast. COMPARISON:  CT HEAD February 23, 2018 FINDINGS: BRAIN: No intraparenchymal hemorrhage, mass effect nor midline shift. The ventricles and sulci are normal for age. Confluent supratentorial white matter hypodensities. Patchy bilateral basal ganglia hypodensities. No acute large vascular territory infarcts. No abnormal extra-axial fluid collections. Basal cisterns are patent. VASCULAR: Moderate calcific atherosclerosis of the carotid siphons. SKULL: No skull fracture. Small LEFT frontal scalp hematoma, decreased from yesterday. SINUSES/ORBITS: Trace paranasal sinus mucosal thickening without air-fluid levels. Mastoid air cells are well aerated.The included ocular globes and orbital contents are non-suspicious. Status post bilateral ocular lens implants. OTHER: Patient is edentulous. IMPRESSION: 1. No acute intracranial process. Small residual LEFT frontal scalp hematoma. No skull fracture. Stable examination including severe chronic small vessel  ischemic disease. Electronically Signed   By: Elon Alas M.D.   On: 02/24/2018 22:51   Ct Head Wo Contrast  Result Date: 02/23/2018 CLINICAL DATA:  Fall, head injury, left frontal scalp hematoma EXAM: CT HEAD WITHOUT CONTRAST TECHNIQUE: Contiguous axial images were obtained from the base of the skull through the vertex without intravenous contrast. COMPARISON:  02/08/2017 FINDINGS: Brain: Age related brain atrophy and extensive chronic white matter microvascular ischemic changes throughout both cerebral hemispheres. No acute intracranial hemorrhage, mass lesion, new infarction, midline shift, herniation, hydrocephalus, or extra-axial fluid collection. Cisterns are patent. Cerebellar atrophy as well. Vascular: Intracranial atherosclerosis noted.  No hyperdense vessel. Skull: Left frontal scalp acute soft tissue hematoma. No underlying skull fracture. Sinuses/Orbits: Sinuses and mastoids are clear.  Symmetric orbits. Other: None. IMPRESSION: Atrophy and extensive chronic white matter microvascular ischemic changes. Acute left anterior frontal scalp hematoma without intracranial hemorrhage or skull fracture. Electronically Signed   By: Jerilynn Mages.  Shick M.D.   On: 02/23/2018 17:00   Pelvis Portable  Result Date: 02/23/2018 CLINICAL DATA:  Status post internal fixation of left hip fracture. EXAM: PORTABLE PELVIS 1-2 VIEWS COMPARISON:  Pelvic radiograph performed earlier today at 1:51 p.m. FINDINGS: No new fractures are seen. Pins are noted transfixing the patient's left femoral fracture in near anatomic alignment. Both femoral heads are seated normally within their respective acetabula. The sacroiliac joints are unremarkable in appearance. The visualized bowel gas pattern is grossly unremarkable in appearance. Scattered phleboliths are noted within the pelvis. IMPRESSION: Status post internal fixation of left femoral fracture in near anatomic alignment. Electronically Signed   By: Garald Balding M.D.   On:  02/23/2018 23:57   Dg Pelvis Portable  Result Date: 02/23/2018 CLINICAL DATA:  Fall today EXAM: PORTABLE PELVIS 1-2 VIEWS COMPARISON:  02/08/2017 FINDINGS: There is no evidence of pelvic fracture or diastasis. No pelvic bone lesions are seen. Atherosclerotic calcification. IMPRESSION: Negative. Electronically Signed   By: Franchot Gallo M.D.   On: 02/23/2018 14:23   Dg Chest Port 1 View  Result Date: 02/23/2018 CLINICAL DATA:  Fall today EXAM: PORTABLE CHEST 1 VIEW COMPARISON:  06/11/2017 FINDINGS: Heart size within normal limits. Negative for heart failure. Atherosclerotic aortic arch. Lungs are clear. Apical scarring left greater than right IMPRESSION: No active disease. Electronically Signed   By: Franchot Gallo M.D.   On: 02/23/2018 14:22   Dg Shoulder Left Port  Result Date: 02/23/2018 CLINICAL DATA:  Fall EXAM: LEFT SHOULDER - 1 VIEW COMPARISON:  None. FINDINGS: There is no evidence of fracture or dislocation. There is no evidence of arthropathy or other focal bone abnormality. Soft tissues are unremarkable. IMPRESSION: Negative. Electronically Signed   By: Franchot Gallo M.D.   On: 02/23/2018 17:24   Dg C-arm 1-60 Min  Result Date: 02/23/2018 CLINICAL DATA:  Internal fixation hip fracture EXAM: OPERATIVE LEFT HIP (WITH PELVIS IF PERFORMED) multiple VIEWS TECHNIQUE: Fluoroscopic spot image(s) were submitted for interpretation post-operatively. COMPARISON:  None. FINDINGS: Multiple spot radiographs demonstrate 3 cannulated screws spanning the LEFT femoral neck. No fracture or dislocation. IMPRESSION: No complication following internal fixation LEFT femoral neck fracture. Electronically Signed   By: Suzy Bouchard M.D.   On: 02/23/2018 22:56   Dg C-arm 1-60 Min  Result Date: 02/23/2018 CLINICAL DATA:  Distal radius fracture, external fixation EXAM: LEFT WRIST - COMPLETE 3+ VIEW; DG C-ARM 61-120 MIN COMPARISON:  None. FINDINGS: Spot fluoroscopic views during external fixation of left wrist  complex comminuted intra-articular fracture. There is improved alignment at the wrist joint with residual displacement of the distal radius fragments. Bones are osteopenic. IMPRESSION: Status post external fixation of the left wrist fracture dislocation with improved alignment. Electronically Signed   By: Jerilynn Mages.  Shick M.D.   On: 02/23/2018 21:36   Dg Hip Operative Unilat With Pelvis Left  Result Date: 02/23/2018 CLINICAL DATA:  Internal fixation hip fracture EXAM: OPERATIVE LEFT HIP (WITH PELVIS IF PERFORMED) multiple VIEWS TECHNIQUE: Fluoroscopic spot image(s) were submitted for interpretation post-operatively. COMPARISON:  None. FINDINGS: Multiple spot radiographs demonstrate 3 cannulated screws spanning the LEFT femoral neck. No fracture or dislocation. IMPRESSION: No complication following internal fixation LEFT femoral neck fracture. Electronically Signed   By: Suzy Bouchard M.D.   On: 02/23/2018 22:56   Dg Hip Unilat W Or Wo Pelvis 2-3 Views Left  Result Date: 02/23/2018 CLINICAL DATA:  Left hip pain and stiffness EXAM: DG HIP (WITH OR WITHOUT PELVIS) 2-3V LEFT COMPARISON:  02/08/2017 FINDINGS: There is cortical irregularity of the proximal left hip surgical neck with mild  foreshortening suspicious for a impacted subcapital femoral neck fracture. Bones are osteopenic. Visualized left hemipelvis intact. Peripheral atherosclerosis noted. IMPRESSION: Findings suspicious for a subtle proximal left hip subcapital femoral neck impacted fracture. Osteopenia Atherosclerosis Electronically Signed   By: Jerilynn Mages.  Shick M.D.   On: 02/23/2018 17:44    Scheduled Meds: . amLODipine  5 mg Oral Daily  . budesonide  0.25 mg Nebulization BID  . docusate sodium  100 mg Oral BID  . enoxaparin (LOVENOX) injection  30 mg Subcutaneous Q24H  . ferrous sulfate  325 mg Oral TID PC  . levothyroxine  88 mcg Oral QAC breakfast  . pantoprazole  40 mg Oral Daily  . tiotropium  18 mcg Inhalation Daily   Continuous  Infusions: .  ceFAZolin (ANCEF) IV Stopped (02/25/18 0531)  . levETIRAcetam 500 mg (02/25/18 1207)     LOS: 2 days   Time spent: 35 minutes.  Vance Gather, MD [facility] Pager [phone]  If 7PM-7AM, please contact night-coverage [email or web address] Password [email or web address] 02/25/2018, 1:52 PM

## 2018-02-25 NOTE — Plan of Care (Signed)

## 2018-02-25 NOTE — Progress Notes (Signed)
Occupational Therapy Treatment Patient Details Name: Rebecca Petty MRN: [id number] DOB: 1926/05/22 Today's Date: 02/25/2018    History of present illness Pt is an 84 y/o female who presents s/p fall, sustaining an open grade 3 left distal radius and ulna fracture, L femoral neck shaft fracture, and shoulder skin tear measuring 5x5 cm. Pt is now s/p external fixator to the L distal radius, repair of degloving skin injury to the L shoulder, and closed reduction with percutaneous pin fixation of the L femoral neck. Pt is currently NWB to the L wrist, WBAT through the L elbow, and WBAT through the LLE. PMH significant for thyroid disease, HTN. 4/18 Pt had a grand mal seizure and now has seizure precautions.    OT comments  Pt presents sitting up in recliner, pleasant and willing to participate in therapy session. Pt engaging in simple grooming ADLs while seated with setup/minguard assist. Pt requiring increased time and effort to move RUE to complete ADLs. Pt engaging in gentle ROM to LUE and RUE within pt tolerance. Feel SNF recommendation remains appropriate at this time. Will continue to follow acutely for continued UE edema management and management of UE deficits, safety and independence with ADLs and mobility.    Follow Up Recommendations  SNF;Supervision/Assistance - 24 hour    Equipment Recommendations  Other (comment)(TBD in next venue )          Precautions / Restrictions Precautions Precautions: Fall Precaution Comments: Ex-fix on L wrist; very thin/fragile skin Restrictions Weight Bearing Restrictions: Yes LUE Weight Bearing: Weight bear through elbow only LLE Weight Bearing: Weight bearing as tolerated       Mobility Bed Mobility Overal bed mobility: Needs Assistance Bed Mobility: Supine to Sit     Supine to sit: Max assist;+2 for physical assistance;HOB elevated     General bed mobility comments: OOB in recliner upon arrival   Transfers Overall transfer level:  Needs assistance Equipment used: 1 person hand held assist Transfers: Sit to/from [facility] Sit to Stand: Max assist;+2 safety/equipment Stand pivot transfers: Max assist;+2 safety/equipment       General transfer comment: bil knees blocked with +1 max assist to rise from EOB and pivot to recliner to the R. LUE supported in elevation during transfer but pt able to hold her lower arm up to help as well. Pt able to minimally assist with LEs and use of RUE to push into standing.    Balance Overall balance assessment: Needs assistance Sitting-balance support: Feet supported;Single extremity supported Sitting balance-Leahy Scale: Poor Sitting balance - Comments: overall min A for sitting balance; brief periods of min guard with RUE support on EOB; leaning to R to offset wt on L hip Postural control: Right lateral lean;Other (comment)   Standing balance-Leahy Scale: Zero Standing balance comment: Max assist for standing balance                            ADL either performed or assessed with clinical judgement   ADL Overall ADL's : Needs assistance/impaired     Grooming: Wash/dry face;Set up;Sitting Grooming Details (indicate cue type and reason): supported sitting in recliner; limited AROM of RUE pt only able to wash lower portion of face                                General ADL Comments: pt up in recliner upon arrival; completed  simple grooming ADLs and assisted with light ROM to LUE within pt tolerance                       Cognition Arousal/Alertness: Awake/alert Behavior During Therapy: WFL for tasks assessed/performed Overall Cognitive Status: Impaired/Different from baseline Area of Impairment: Orientation;Memory;Safety/judgement;Awareness                 Orientation Level: Disoriented to;Place;Time;Situation   Memory: Decreased recall of precautions;Decreased short-term memory   Safety/Judgement: Decreased  awareness of safety;Decreased awareness of deficits Awareness: Intellectual   General Comments: Likely baseline. Pt did not remember that she had surgery, or how long she has been in the hospital. Had a seizure last night and does not remember any details surrounding that either.         Exercises General Exercises - Upper Extremity Shoulder Flexion: AROM;Right;10 reps;Seated;AAROM Shoulder Horizontal ABduction: AAROM;PROM;5 reps;Left;Seated(minimal due to pain, gravity eliminated supported on pillow) Elbow Extension: AAROM;Left;Seated;PROM;5 reps(minimal due to pain) Digit Composite Flexion: AAROM;AROM;PROM;Left;Seated;10 reps(pt actively able to move 1st 2nd digits, PROM for thumb 4th and 5th digit) Composite Extension: AROM;AAROM;PROM;Left;Seated(pt actively able to move 1st 2nd digits, PROM for thumb 4th and 5th digit)                Pertinent Vitals/ Pain       Pain Assessment: Faces Faces Pain Scale: Hurts even more Pain Location: L wrist and L hip Pain Descriptors / Indicators: Operative site guarding;Aching;Sore Pain Intervention(s): Monitored during session;Repositioned                                                          Frequency  Min 3X/week        Progress Toward Goals  OT Goals(current goals can now be found in the care plan section)  Progress towards OT goals: Progressing toward goals  Acute Rehab OT Goals Patient Stated Goal: less pain; regain independence  OT Goal Formulation: With patient/family Time For Goal Achievement: 03/10/18 Potential to Achieve Goals: Good  Plan Discharge plan remains appropriate                     AM-PAC PT "6 Clicks" Daily Activity     Outcome Measure   Help from another person eating meals?: A Little Help from another person taking care of personal grooming?: A Little Help from another person toileting, which includes using toliet, bedpan, or urinal?: Total Help from another  person bathing (including washing, rinsing, drying)?: A Lot Help from another person to put on and taking off regular upper body clothing?: A Lot Help from another person to put on and taking off regular lower body clothing?: Total 6 Click Score: 12    End of Session Equipment Utilized During Treatment: (L wrist ex-fix)  OT Visit Diagnosis: Other abnormalities of gait and mobility (R26.89);Muscle weakness (generalized) (M62.81);History of falling (Z91.81);Pain Pain - Right/Left: Left Pain - part of body: Hand;Hip   Activity Tolerance Patient tolerated treatment well   Patient Left in chair;with call bell/phone within reach;with family/visitor present;with nursing/sitter in room   Nurse Communication Mobility status        Time: 5009-3818 OT Time Calculation (min): 19 min  Charges: OT General Charges $OT Visit: 1 Visit OT Treatments $Therapeutic Activity: 8-22 mins  Bubba Hales  Megan Salon, [place] Pager [phone] 02/25/2018    Raymondo Band 02/25/2018, 1:30 PM

## 2018-02-25 NOTE — Plan of Care (Signed)
  Problem: Education: Goal: Knowledge of General Education information will improve 02/25/2018 1610 by Rance Muir, RN Outcome: Progressing 02/25/2018 1500 by Rance Muir, RN Outcome: Progressing   Problem: Health Behavior/Discharge Planning: Goal: Ability to manage health-related needs will improve 02/25/2018 1610 by Rance Muir, RN Outcome: Progressing 02/25/2018 1500 by Rance Muir, RN Outcome: Progressing   Problem: Clinical Measurements: Goal: Ability to maintain clinical measurements within normal limits will improve 02/25/2018 1610 by Rance Muir, RN Outcome: Progressing 02/25/2018 1500 by Rance Muir, RN Outcome: Progressing Goal: Will remain free from infection 02/25/2018 1610 by Rance Muir, RN Outcome: Progressing 02/25/2018 1500 by Rance Muir, RN Outcome: Progressing Goal: Diagnostic test results will improve 02/25/2018 1610 by Rance Muir, RN Outcome: Progressing 02/25/2018 1500 by Rance Muir, RN Outcome: Progressing Goal: Respiratory complications will improve 02/25/2018 1610 by Rance Muir, RN Outcome: Progressing 02/25/2018 1500 by Rance Muir, RN Outcome: Progressing Goal: Cardiovascular complication will be avoided 02/25/2018 1610 by Rance Muir, RN Outcome: Progressing 02/25/2018 1500 by Rance Muir, RN Outcome: Progressing   Problem: Activity: Goal: Risk for activity intolerance will decrease 02/25/2018 1610 by Rance Muir, RN Outcome: Progressing 02/25/2018 1500 by Rance Muir, RN Outcome: Progressing   Problem: Nutrition: Goal: Adequate nutrition will be maintained 02/25/2018 1610 by Rance Muir, RN Outcome: Progressing 02/25/2018 1500 by Rance Muir, RN Outcome: Progressing   Problem: Coping: Goal: Level of anxiety will decrease 02/25/2018 1610 by Rance Muir, RN Outcome: Progressing 02/25/2018 1500 by Rance Muir, RN Outcome: Progressing   Problem: Elimination: Goal: Will not experience complications related to bowel motility 02/25/2018 1610 by Rance Muir, RN Outcome:  Progressing 02/25/2018 1500 by Rance Muir, RN Outcome: Progressing Goal: Will not experience complications related to urinary retention 02/25/2018 1610 by Rance Muir, RN Outcome: Progressing 02/25/2018 1500 by Rance Muir, RN Outcome: Progressing   Problem: Pain Managment: Goal: General experience of comfort will improve 02/25/2018 1610 by Rance Muir, RN Outcome: Progressing 02/25/2018 1500 by Rance Muir, RN Outcome: Progressing   Problem: Safety: Goal: Ability to remain free from injury will improve 02/25/2018 1610 by Rance Muir, RN Outcome: Progressing 02/25/2018 1500 by Rance Muir, RN Outcome: Progressing   Problem: Skin Integrity: Goal: Risk for impaired skin integrity will decrease 02/25/2018 1610 by Rance Muir, RN Outcome: Progressing 02/25/2018 1500 by Rance Muir, RN Outcome: Progressing

## 2018-02-25 NOTE — Progress Notes (Signed)
EEG complete - results pending 

## 2018-02-25 NOTE — Progress Notes (Signed)
Physical Therapy Treatment Patient Details Name: Rebecca Petty MRN: [id number] DOB: 1926/03/02 Today's Date: 02/25/2018    History of Present Illness Pt is an 84 y/o female who presents s/p fall, sustaining an open grade 3 left distal radius and ulna fracture, L femoral neck shaft fracture, and shoulder skin tear measuring 5x5 cm. Pt is now s/p external fixator to the L distal radius, repair of degloving skin injury to the L shoulder, and closed reduction with percutaneous pin fixation of the L femoral neck. Pt is currently NWB to the L wrist, WBAT through the L elbow, and WBAT through the LLE. PMH significant for thyroid disease, HTN. 4/18 Pt had a grand mal seizure and now has seizure precautions.     PT Comments    Pt progressing towards physical therapy goals. Was able to tolerate OOB to chair this session and demonstrated increased participation in transfer. Pt continues to require max assist +1 (+2 helpful) for basic transfers, but will definitely need +2 assist to progress ambulation. Will continue to follow and progress as able per POC.   Follow Up Recommendations  SNF;Supervision/Assistance - 24 hour     Equipment Recommendations  Other (comment)(TBD by next venue of care)    Recommendations for Other Services       Precautions / Restrictions Precautions Precautions: Fall Precaution Comments: Ex-fix on L wrist; very thin/fragile skin Restrictions Weight Bearing Restrictions: Yes LUE Weight Bearing: Weight bear through elbow only LLE Weight Bearing: Weight bearing as tolerated    Mobility  Bed Mobility Overal bed mobility: Needs Assistance Bed Mobility: Supine to Sit     Supine to sit: Max assist;+2 for physical assistance;HOB elevated     General bed mobility comments: +2 assist to advance LEs over EOB and to elevate trunk into sitting; exiting bed to the R  Transfers Overall transfer level: Needs assistance Equipment used: 1 person hand held assist Transfers:  Sit to/from [facility] Sit to Stand: Max assist;+2 safety/equipment Stand pivot transfers: Max assist;+2 safety/equipment       General transfer comment: bil knees blocked with +1 max assist to rise from EOB and pivot to recliner to the R. LUE supported in elevation during transfer but pt able to hold her lower arm up to help as well. Pt able to minimally assist with LEs and use of RUE to push into standing.  Ambulation/Gait             General Gait Details: Unable   Marine scientist Rank[place] (Stroke Patients Only)       Balance Overall balance assessment: Needs assistance Sitting-balance support: Feet supported;Single extremity supported Sitting balance-Leahy Scale: Poor Sitting balance - Comments: overall min A for sitting balance; brief periods of min guard with RUE support on EOB; leaning to R to offset wt on L hip Postural control: Right lateral lean;Other (comment)   Standing balance-Leahy Scale: Zero Standing balance comment: Max assist for standing balance                             Cognition Arousal/Alertness: Awake/alert Behavior During Therapy: WFL for tasks assessed/performed Overall Cognitive Status: Impaired/Different from baseline Area of Impairment: Orientation;Memory;Safety/judgement;Awareness                 Orientation Level: Disoriented to;Place;Time;Situation   Memory: Decreased recall of precautions;Decreased short-term memory  Safety/Judgement: Decreased awareness of safety;Decreased awareness of deficits Awareness: Intellectual   General Comments: Likely baseline. Pt did not remember that she had surgery, or how long she has been in the hospital. Had a seizure last night and does not remember any details surrounding that either.       Exercises      General Comments        Pertinent Vitals/Pain Pain Assessment: Faces Faces Pain Scale: Hurts even more Pain  Location: L wrist and L hip Pain Descriptors / Indicators: Operative site guarding;Aching;Sore Pain Intervention(s): Monitored during session    Home Living                      Prior Function            PT Goals (current goals can now be found in the care plan section) Acute Rehab PT Goals Patient Stated Goal: less pain; regain independence  PT Goal Formulation: With patient Time For Goal Achievement: 03/10/18 Potential to Achieve Goals: Good Progress towards PT goals: Progressing toward goals    Frequency    Min 3X/week      PT Plan Current plan remains appropriate    Co-evaluation              AM-PAC PT "6 Clicks" Daily Activity  Outcome Measure  Difficulty turning over in bed (including adjusting bedclothes, sheets and blankets)?: Unable Difficulty moving from lying on back to sitting on the side of the bed? : Unable Difficulty sitting down on and standing up from a chair with arms (e.g., wheelchair, bedside commode, etc,.)?: A Lot Help needed moving to and from a bed to chair (including a wheelchair)?: A Lot Help needed walking in hospital room?: Total Help needed climbing 3-5 steps with a railing? : Total 6 Click Score: 8    End of Session Equipment Utilized During Treatment: Gait belt;Oxygen Activity Tolerance: Patient tolerated treatment well Patient left: in chair;with call bell/phone within reach;with chair alarm set;with nursing/sitter in room;with family/visitor present Nurse Communication: Mobility status PT Visit Diagnosis: Unsteadiness on feet (R26.81);History of falling (Z91.81);Pain Pain - Right/Left: Left Pain - part of body: Shoulder;Hip;Hand     Time: 1030-1059 PT Time Calculation (min) (ACUTE ONLY): 29 min  Charges:  $Therapeutic Activity: 23-37 mins                    G Codes:       Rebecca Petty, PT, DPT Acute Rehabilitation Services Pager: [phone]    Rebecca Petty 02/25/2018, 11:36 AM

## 2018-02-25 NOTE — Clinical Social Work Note (Signed)
Clinical Social Work Assessment  Patient Details  Name: Rebecca Petty MRN: [id number] Date of Birth: 18-Nov-1925  Date of referral:  02/25/18               Reason for consult:  Facility Placement                Permission sought to share information with:  Family Supports Permission granted to share information::     Name::     Dorthy   Agency::  [facility].  Relationship::  Daughter  Contact Information:  [phone]  Housing/Transportation Living arrangements for the past 2 months:  [place] of Information:  Adult Children Patient Interpreter Needed:  None Criminal Activity/Legal Involvement Pertinent to Current Situation/Hospitalization:  No - Comment as needed Significant Relationships:  Adult Children Lives with:  Self Do you feel safe going back to the place where you live?    Need for family participation in patient care:  No (Coment)  Care giving concerns:  Pt is only alert to self and place. Pt's daughter at bedside.  Social Worker assessment / plan:  CSW spoke with pt's daughter at bedside. Pt's daughter is agreeable for pt to go to SNF at d/c. Pt's daughter request that CSW send referral to [facility] [place]. CSW to follow up with facility. Pt had a seizure yesterday. D/c date undetermined at this time.  Employment status:  Retired Forensic scientist:  Medicare PT Recommendations:  [place] / Referral to community resources:  [place]  Patient/Family's Response to care:  Pt's daughter verbalized understanding of CSW role and expressed appreciation for support. Pt's daughter denies any concern regarding pt care at this time.   Patient/Family's Understanding of and Emotional Response to Diagnosis, Current Treatment, and Prognosis:  Pt's daughter understanding and realistic regarding pt's physical limitations. Pt's daughter understands the need for pt go to SNF at d/c--Pt's daughter agreeable. Pt's daughter  denies any concern regarding pt's treatment plan at this time. CSW will continue to provide support and facilitate d/c needs.   Emotional Assessment Appearance:  Appears stated age Attitude/Demeanor/Rapport:  Unable to Assess Affect (typically observed):  Unable to Assess Orientation:  Oriented to Self, Oriented to Place Alcohol / Substance use:  Not Applicable Psych involvement (Current and /or in the community):  No (Comment)  Discharge Needs  Concerns to be addressed:  Care Coordination, Basic Needs Readmission within the last 30 days:  No Current discharge risk:  Dependent with Mobility Barriers to Discharge:  Continued Medical Work up   [employer], LCSW 02/25/2018, 10:02 AM

## 2018-02-25 NOTE — Consult Note (Signed)
Neurology Consultation Reason for Consult: Seizure Referring Physician: Georges Mouse  CC: Seizure  History is obtained from: Family, nursing  HPI: Rebecca Petty is a 84 y.o. female with a history of mild short-term memory loss who presents with new onset seizure.  She presents to the hospital after breaking her arm and hip due to mechanical fall.  She underwent surgery yesterday and was doing reasonably well today until she had sudden behavioral arrest, left gaze deviation bilateral arm flexion and clonic activity lasting approximately 45 seconds.  Following this she initially was essentially unresponsive, but gradually improved and began vocalizing but then she received Ativan and has since been sedated.   ROS: A 14 point ROS was performed and is negative except as noted in the HPI.   Past Medical History:  Diagnosis Date  . Fracture of distal radius and ulna, left, open type III, initial encounter 02/23/2018  . Fracture of femoral neck, left ([place]) 02/23/2018  . GERD (gastroesophageal reflux disease)   . Hypertension   . Thyroid disease      History reviewed. No pertinent family history.   Social History: Unable to obtain due to altered mental status.   Exam: Current vital signs: BP (!) 158/81 (BP Location: Right Arm)   Pulse (!) 103   Temp 98.4 F (36.9 C) (Oral)   Resp 18   Ht 5\' 5"  (1.651 m)   Wt 54.4 kg (120 lb)   SpO2 98%   BMI 19.97 kg/m  Vital signs in last 24 hours: Temp:  [98.2 F (36.8 C)-99.2 F (37.3 C)] 98.4 F (36.9 C) (04/18 2018) Pulse Rate:  [75-103] 103 (04/18 2136) Resp:  [18] 18 (04/18 2018) BP: (111-158)/(63-81) 158/81 (04/18 2136) SpO2:  [96 %-100 %] 98 % (04/18 2059)   Physical Exam  Constitutional: Appears elderly  psych: Does not answer questions Eyes: No scleral injection HENT: No OP obstrucion Head: Normocephalic.  Cardiovascular: Normal rate and regular rhythm.  Respiratory: Effort normal, non-labored breathing GI: Soft.  No  distension. There is no tenderness.  Skin: WDI  Neuro: Mental Status: Patient is obtunded, with vigorous stimulation, she does  Briskly localize.   Cranial Nerves: II: She does not blink to threat pupils are equal, round, and reactive to light.   III,IV, VI: With vigorous stimulation her eyes conjugate however, as soon as noxious stimulation is stopped she becomes disconjugate with an underlying exophoria V: Facial sensation is symmetric to temperature VII: Facial movement is symmetric.  VIII: hearing is intact to voice Motor: She briskly localizes with the right arm, the left arm is held in place with hardware.  She withdraws bilateral lower extremities to noxious stimulus Sensory: She responds to noxious stimulation in all 4 extremities Cerebellar: Does not perform  I have reviewed labs in epic and the results pertinent to this consultation are: CMP-sodium 138, glucose 151, calcium borderline at 7.9 but albumin is 2.9  I have reviewed the images obtained: CT head-unremarkable intracranially  Impression: 84 year old female with new onset seizure in the setting of recent fall.  She did have a head injury, but I suspect this may be more due to an underlying procedure predisposition with subsequent lower seizure threshold due to physiological stress.  She has been started on IV Keppra and I agree with continuing this.  Given that she was improving prior to Ativan, I suspect that most of her mental status currently is pharmacologic.  Recommendations: 1) MRI if able from a orthopedic perspective 2) EEG 3) continue  Keppra 500 mill grams twice daily   Roland Rack, MD [facility] [phone]  If 7pm- 7am, please page neurology on call as listed in [place].

## 2018-02-25 NOTE — Progress Notes (Signed)
Called to bedside in reference to pt with seizure activity.  Larkin Ina RN reports the pt suffered a 45 second seizure.  Pt appeared to be postictal on my arrival, obtunded, unable to follow commands.  Pupils were equal round and reactive. CBG was 158.  Dr. Blaine Hamper responded to bedside and ordered stat head CT, prn ativan, and neuro consult.  VS remained WNL during the encounter.   Respirations were equal and unlabored, breath sounds were CTA bilaterally.    Larkin Ina, RN reports that prior to CT transport, the pt was alert, responding to commands, and recognized family members at bedside.  She did become agitated and required prn ativan 1mg  at 2212.    Head CT revealed no acute intracranial process.  MAR review shows Keppra 1000mg  given at 2206.   Pt remains on continuous telemetry and SpO2 monitoring.   Order in place for EEG and MRI  Pt was placed on our radar.

## 2018-02-25 NOTE — Progress Notes (Signed)
SPORTS MEDICINE AND JOINT REPLACEMENT  Lara Mulch, MD    Carlyon Shadow, PA-C [address], [place], [place]  [address]                             [phone]   PROGRESS NOTE  Subjective:  negative for Chest Pain  negative for Shortness of Breath  negative for Nausea/Vomiting   negative for Calf Pain  negative for Bowel Movement   Tolerating Diet: yes         Patient reports pain as 3 on 0-10 scale.    Objective: Vital signs in last 24 hours:    Patient Vitals for the past 24 hrs:  BP Temp Temp src Pulse Resp SpO2  02/25/18 0447 (!) 181/94 - - - - 95 %  02/25/18 0431 (!) 152/103 98.4 F (36.9 C) Axillary 78 - -  02/24/18 2136 (!) 158/81 - - (!) 103 - -  02/24/18 2059 - - - - - 98 %  02/24/18 2018 (!) 144/63 98.4 F (36.9 C) Oral 79 18 96 %  02/24/18 1406 111/67 98.2 F (36.8 C) Oral 75 18 100 %  02/24/18 0741 - - - - - 99 %  02/24/18 0738 - - - - - 99 %    @flow {1959:LAST@   Intake/Output from previous day:   04/18 0701 - 04/19 0700 In: 330 [P.O.:330] Out: -    Intake/Output this shift:   No intake/output data recorded.   Intake/Output      04/18 0701 - 04/19 0700 04/19 0701 - 04/20 0700   P.O. 330    I.V. (mL/kg)     IV Piggyback     Total Intake(mL/kg) 330 (6.1)    Urine (mL/kg/hr)     Blood     Total Output     Net +330         Urine Occurrence 4 x       LABORATORY DATA: Recent Labs    02/23/18 1416 02/23/18 1434 02/24/18 1022  WBC 11.5*  --  8.5  HGB 14.2 13.9 10.6*  HCT 41.4 41.0 31.3*  PLT 166  --  160   Recent Labs    02/23/18 1416 02/23/18 1434 02/24/18 0646  NA 140 141 138  K 2.9* 2.9* 3.9  CL 109 106 108  CO2 20*  --  21*  BUN 11 13 16   CREATININE 0.70 0.70 0.97  GLUCOSE 144* 143* 151*  CALCIUM 8.3*  --  7.9*   Lab Results  Component Value Date   INR 1.03 02/23/2018    Examination:  General appearance: alert, cooperative and no distress Extremities: extremities normal, atraumatic, no cyanosis or  edema  Wound Exam: clean, dry, intact   Drainage:  None: wound tissue dry  Motor Exam: Opposition, Pinch, Quadriceps and Hamstrings Intact  Sensory Exam: Radial, Ulnar, Superficial Peroneal, Deep Peroneal and Tibial normal   Assessment:    2 Days Post-Op  Procedure(s) (LRB): EXTERNAL FIXATION Left ARM, irrigation and debridement bone, complex wound closure (Left) CANNULATED LEFT HIP PINNING (Left)  ADDITIONAL DIAGNOSIS:  Principal Problem:   Fracture of femoral neck, left (H[facility]) Active Problems:   Fracture of distal radius and ulna, left, open type III, initial encounter   Hypokalemia   Femoral fracture (HCC)     Plan: Physical Therapy as ordered WBAT left lower NWB left upper  DVT Prophylaxis:  Lovenox  DISCHARGE PLAN: Skilled Nursing Facility/Rehab  Patient doing  well and resting comfortably on exam. Family at bedside. Continue with physical therapy. Advance diet as tolerated. Dressing change tomorrow (Saturday). Medicine following        Donia Ast 02/25/2018, 7:23 AM

## 2018-02-25 NOTE — Procedures (Signed)
Date of recording 02/25/2018  Referring physician Dr Maudie Mercury  Reason for the study  35 femaleseizures.  Technical Digital EEG recording using 10-20 electrode international system  Description of the recording  posterior dominant rhythm is 8-9 HZ symmetrical and reactive Non REM Stage II sleep was obtained vertex transients and sleep spindles were seen epileptiform activity was not seen during this recording.    Impression The EEG is of normal in awake and sleep state

## 2018-02-25 NOTE — Progress Notes (Addendum)
Neurology progress note  Subjective: Currently awake and orient to hospital but no [place]. She states she is in pain in her left arm and leg. No other major complaints. She does state she is drowsy.   Exam: Vitals:   02/25/18 0431 02/25/18 0447  BP: (!) 152/103 (!) 181/94  Pulse: 78   Resp:    Temp: 98.4 F (36.9 C)   SpO2:  95%    Physical Exam   HEENT-  Hematoma on left side of face   Cardiovascular-  pulses palpable throughout   Lungs-no rhonchi or wheezing noted, no excessive working breathing.  Saturations within normal limits Abdomen- All 4 quadrants palpated and nontender Extremities- Warm, dry and intact--with external fixator on the left arm along with major bruising of left arm  Musculoskeletal- tenderness,  Swelling left arm from fall Skin-warm and dry, major bruising of left arm  Neuro:  Mental Status: drowsy, oriented to hospital.  Speech fluent without evidence of aphasia.  Able to follow 3 step commands without difficulty.--received hydrocodone at 0455.  Cranial Nerves: II:  Visual fields grossly normal,  III,IV, VI: ptosis not present, extra-ocular motions intact bilaterally pupils equal, round, reactive to light and accommodation V,VII: smile symmetric, facial light touch sensation normal bilaterally VIII: hearing decreased IX,X: uvula rises symmetrically XI: bilateral shoulder shrug XII: midline tongue extension Motor: Left arm in external fixator and pain full to move. Right arm 5/5, left leg able to move 1/5 at knee and 3/5 at ankle but hip is broken and has pain throughout.  Sensory: Pinprick and light touch intact throughout, bilaterally Plantars: Right: downgoing   Left: downgoing   Medications:  Scheduled: . amLODipine  5 mg Oral Daily  . budesonide  0.25 mg Nebulization BID  . docusate sodium  100 mg Oral BID  . enoxaparin (LOVENOX) injection  30 mg Subcutaneous Q24H  . ferrous sulfate  325 mg Oral TID PC  . levothyroxine  88 mcg Oral QAC  breakfast  . pantoprazole  40 mg Oral Daily  . tiotropium  18 mcg Inhalation Daily   Continuous: . 0.9 % NaCl with KCl 20 mEq / L 75 mL/hr at 02/24/18 2110  .  ceFAZolin (ANCEF) IV Stopped (02/25/18 0531)  . levETIRAcetam     ONG:[id number], alum & mag hydroxide-simeth, bisacodyl, HYDROcodone-acetaminophen, LORazepam, magnesium citrate, menthol-cetylpyridinium **OR** phenol, metoCLOPramide **OR** metoCLOPramide (REGLAN) injection, morphine injection, ondansetron **OR** ondansetron (ZOFRAN) IV, polyethylene glycol  Pertinent Labs/Diagnostics: Unable to get MRI due to fixator. (per daughter) will see what MRI states  EEG pending  Dg Elbow 2 Views Left  Result Date: 02/23/2018 CLINICAL DATA:  Fall EXAM: LEFT ELBOW - 2 VIEW COMPARISON:  None. FINDINGS: There is no evidence of fracture, dislocation, or joint effusion. There is no evidence of arthropathy or other focal bone abnormality. Soft tissues are unremarkable. IMPRESSION: Negative. Electronically Signed   By: Franchot Gallo M.D.   On: 02/23/2018 17:27   Dg Wrist 2 Views Left  Result Date: 02/23/2018 CLINICAL DATA:  Fall EXAM: LEFT WRIST - 2 VIEW COMPARISON:  None. FINDINGS: Image quality degraded by plaster splint. Comminuted fracture distal radius with radiocarpal dislocation. Suboptimal anatomical evaluation, consider CT for further evaluation. Questionable fracture distal ulna. IMPRESSION: Fracture dislocation of the wrist. Recommend CT for better anatomical evaluation. Electronically Signed   By: Franchot Gallo M.D.   On: 02/23/2018 17:26   Dg Wrist Complete Left  Result Date: 02/23/2018 CLINICAL DATA:  Distal radius fracture, external fixation EXAM: LEFT WRIST - COMPLETE  3+ VIEW; DG C-ARM 61-120 MIN COMPARISON:  None. FINDINGS: Spot fluoroscopic views during external fixation of left wrist complex comminuted intra-articular fracture. There is improved alignment at the wrist joint with residual displacement of the distal radius  fragments. Bones are osteopenic. IMPRESSION: Status post external fixation of the left wrist fracture dislocation with improved alignment. Electronically Signed   By: Jerilynn Mages.  Shick M.D.   On: 02/23/2018 21:36   Ct Head Wo Contrast  Result Date: 02/24/2018 CLINICAL DATA:  New onset seizure.  History of hypertension. EXAM: CT HEAD WITHOUT CONTRAST TECHNIQUE: Contiguous axial images were obtained from the base of the skull through the vertex without intravenous contrast. COMPARISON:  CT HEAD February 23, 2018 FINDINGS: BRAIN: No intraparenchymal hemorrhage, mass effect nor midline shift. The ventricles and sulci are normal for age. Confluent supratentorial white matter hypodensities. Patchy bilateral basal ganglia hypodensities. No acute large vascular territory infarcts. No abnormal extra-axial fluid collections. Basal cisterns are patent. VASCULAR: Moderate calcific atherosclerosis of the carotid siphons. SKULL: No skull fracture. Small LEFT frontal scalp hematoma, decreased from yesterday. SINUSES/ORBITS: Trace paranasal sinus mucosal thickening without air-fluid levels. Mastoid air cells are well aerated.The included ocular globes and orbital contents are non-suspicious. Status post bilateral ocular lens implants. OTHER: Patient is edentulous. IMPRESSION: 1. No acute intracranial process. Small residual LEFT frontal scalp hematoma. No skull fracture. Stable examination including severe chronic small vessel ischemic disease. Electronically Signed   By: Elon Alas M.D.   On: 02/24/2018 22:51   Ct Head Wo Contrast  Result Date: 02/23/2018 CLINICAL DATA:  Fall, head injury, left frontal scalp hematoma EXAM: CT HEAD WITHOUT CONTRAST TECHNIQUE: Contiguous axial images were obtained from the base of the skull through the vertex without intravenous contrast. COMPARISON:  02/08/2017 FINDINGS: Brain: Age related brain atrophy and extensive chronic white matter microvascular ischemic changes throughout both cerebral  hemispheres. No acute intracranial hemorrhage, mass lesion, new infarction, midline shift, herniation, hydrocephalus, or extra-axial fluid collection. Cisterns are patent. Cerebellar atrophy as well. Vascular: Intracranial atherosclerosis noted.  No hyperdense vessel. Skull: Left frontal scalp acute soft tissue hematoma. No underlying skull fracture. Sinuses/Orbits: Sinuses and mastoids are clear.  Symmetric orbits. Other: None. IMPRESSION: Atrophy and extensive chronic white matter microvascular ischemic changes. Acute left anterior frontal scalp hematoma without intracranial hemorrhage or skull fracture. Electronically Signed   By: Jerilynn Mages.  Shick M.D.   On: 02/23/2018 17:00   Pelvis Portable  Result Date: 02/23/2018 CLINICAL DATA:  Status post internal fixation of left hip fracture. EXAM: PORTABLE PELVIS 1-2 VIEWS COMPARISON:  Pelvic radiograph performed earlier today at 1:51 p.m. FINDINGS: No new fractures are seen. Pins are noted transfixing the patient's left femoral fracture in near anatomic alignment. Both femoral heads are seated normally within their respective acetabula. The sacroiliac joints are unremarkable in appearance. The visualized bowel gas pattern is grossly unremarkable in appearance. Scattered phleboliths are noted within the pelvis. IMPRESSION: Status post internal fixation of left femoral fracture in near anatomic alignment. Electronically Signed   By: Garald Balding M.D.   On: 02/23/2018 23:57   Dg Pelvis Portable  Result Date: 02/23/2018 CLINICAL DATA:  Fall today EXAM: PORTABLE PELVIS 1-2 VIEWS COMPARISON:  02/08/2017 FINDINGS: There is no evidence of pelvic fracture or diastasis. No pelvic bone lesions are seen. Atherosclerotic calcification. IMPRESSION: Negative. Electronically Signed   By: Franchot Gallo M.D.   On: 02/23/2018 14:23   Dg Chest Port 1 View  Result Date: 02/23/2018 CLINICAL DATA:  Fall today EXAM: PORTABLE CHEST 1  VIEW COMPARISON:  06/11/2017 FINDINGS: Heart size  within normal limits. Negative for heart failure. Atherosclerotic aortic arch. Lungs are clear. Apical scarring left greater than right IMPRESSION: No active disease. Electronically Signed   By: Franchot Gallo M.D.   On: 02/23/2018 14:22   Dg Shoulder Left Port  Result Date: 02/23/2018 CLINICAL DATA:  Fall EXAM: LEFT SHOULDER - 1 VIEW COMPARISON:  None. FINDINGS: There is no evidence of fracture or dislocation. There is no evidence of arthropathy or other focal bone abnormality. Soft tissues are unremarkable. IMPRESSION: Negative. Electronically Signed   By: Franchot Gallo M.D.   On: 02/23/2018 17:24   Dg C-arm 1-60 Min  Result Date: 02/23/2018 CLINICAL DATA:  Internal fixation hip fracture EXAM: OPERATIVE LEFT HIP (WITH PELVIS IF PERFORMED) multiple VIEWS TECHNIQUE: Fluoroscopic spot image(s) were submitted for interpretation post-operatively. COMPARISON:  None. FINDINGS: Multiple spot radiographs demonstrate 3 cannulated screws spanning the LEFT femoral neck. No fracture or dislocation. IMPRESSION: No complication following internal fixation LEFT femoral neck fracture. Electronically Signed   By: Suzy Bouchard M.D.   On: 02/23/2018 22:56   Dg C-arm 1-60 Min  Result Date: 02/23/2018 CLINICAL DATA:  Distal radius fracture, external fixation EXAM: LEFT WRIST - COMPLETE 3+ VIEW; DG C-ARM 61-120 MIN COMPARISON:  None. FINDINGS: Spot fluoroscopic views during external fixation of left wrist complex comminuted intra-articular fracture. There is improved alignment at the wrist joint with residual displacement of the distal radius fragments. Bones are osteopenic. IMPRESSION: Status post external fixation of the left wrist fracture dislocation with improved alignment. Electronically Signed   By: Jerilynn Mages.  Shick M.D.   On: 02/23/2018 21:36   Dg Hip Operative Unilat With Pelvis Left  Result Date: 02/23/2018 CLINICAL DATA:  Internal fixation hip fracture EXAM: OPERATIVE LEFT HIP (WITH PELVIS IF PERFORMED) multiple  VIEWS TECHNIQUE: Fluoroscopic spot image(s) were submitted for interpretation post-operatively. COMPARISON:  None. FINDINGS: Multiple spot radiographs demonstrate 3 cannulated screws spanning the LEFT femoral neck. No fracture or dislocation. IMPRESSION: No complication following internal fixation LEFT femoral neck fracture. Electronically Signed   By: Suzy Bouchard M.D.   On: 02/23/2018 22:56   Dg Hip Unilat W Or Wo Pelvis 2-3 Views Left  Result Date: 02/23/2018 CLINICAL DATA:  Left hip pain and stiffness EXAM: DG HIP (WITH OR WITHOUT PELVIS) 2-3V LEFT COMPARISON:  02/08/2017 FINDINGS: There is cortical irregularity of the proximal left hip surgical neck with mild foreshortening suspicious for a impacted subcapital femoral neck fracture. Bones are osteopenic. Visualized left hemipelvis intact. Peripheral atherosclerosis noted. IMPRESSION: Findings suspicious for a subtle proximal left hip subcapital femoral neck impacted fracture. Osteopenia Atherosclerosis Electronically Signed   By: Jerilynn Mages.  Shick M.D.   On: 02/23/2018 17:44   Etta Quill PA-C [facility] [phone]  Attending addendum Patient seen and examined Agree with the history and physical documented above.   Impression:  84 year old woman with new onset seizure in the setting of recent fall. Possible lowering of seizure threshold due to physiological stressors and predisposition due to underlying cognitive deficits. Would recommend antiepileptic treatment. Would recommend workup completion with further brain imaging if possible with MRI.  Current problems: Fracture of distal radius and ulna, Femoral fracture  seizure  Currently no further seizures. Is drowsy but there is some overlay of narcotics. Will need to watch for SE.   Recommendations: 1) MRI if able from a orthopedic perspective--unclear but daughter states they stated "no".  Can pursue it outpatient if unable to do inpatient after the hardware is  removed. 2) EEG--pending-we will follow 3) continue Keppra 500 mill grams twice daily  02/25/2018, 9:49 AM  -- Amie Portland, MD [facility] Pager: [phone] If 7pm to 7am, please call on call as listed on AMION.

## 2018-02-25 NOTE — NC FL2 (Signed)
[place] MEDICAID FL2 LEVEL OF CARE SCREENING TOOL     IDENTIFICATION  Patient Name: Rebecca Petty Birthdate: Jan 17, 1926 Sex: female Admission Date (Current Location): 02/23/2018  [facility] and [place] Number:  Herbalist and Address:  [facility] [place], Benjamin Perez [address], [place], [place] [address]      Provider Number: [id number]  Attending Physician Name and Address:  Patrecia Pour, MD  Relative Name and Phone Number:       Current Level of Care: Hospital Recommended Level of Care: [place] Prior Approval Number:    Date Approved/Denied:   PASRR Number: [id number]  Discharge Plan: SNF    Current Diagnoses: Patient Active Problem List   Diagnosis Date Noted  . Fracture of distal radius and ulna, left, open type III, initial encounter 02/23/2018  . Fracture of femoral neck, left ([place]) 02/23/2018  . Hypokalemia 02/23/2018  . Femoral fracture ([place]) 02/23/2018    Orientation RESPIRATION BLADDER Height & Weight     Self, Place  O2(Nasal Cannula, 3L) Continent Weight: 120 lb (54.4 kg) Height:  5\' 5"  (165.1 cm)  BEHAVIORAL SYMPTOMS/MOOD NEUROLOGICAL BOWEL NUTRITION STATUS      Continent Diet(Regular diet, thin liquids)  AMBULATORY STATUS COMMUNICATION OF NEEDS Skin   Extensive Assist Verbally Surgical wounds(Closed incision left arm, compression wrapped.  CLosed incision left shoulder, guaze dressing.  Closed incision left thigh, Hydrocolloid dressing)                       Personal Care Assistance Level of Assistance  Bathing, Feeding, Dressing Bathing Assistance: Maximum assistance Feeding assistance: Limited assistance Dressing Assistance: Maximum assistance     Functional Limitations Info  Sight, Hearing, Speech Sight Info: Adequate Hearing Info: Adequate Speech Info: Adequate    SPECIAL CARE FACTORS FREQUENCY  OT (By licensed OT), PT (By licensed PT)     PT Frequency: 3x OT Frequency: 3x             Contractures Contractures Info: Not present    Additional Factors Info  Code Status, Allergies Code Status Info: Full [place] Allergies Info: No known allergies           Current Medications (02/25/2018):  This is the current hospital active medication list Current Facility-Administered Medications  Medication Dose Route Frequency Provider Last Rate Last Dose  . 0.9 % NaCl with KCl 20 mEq/ L  infusion   Intravenous Continuous Marchia Bond, MD 75 mL/hr at 02/24/18 2110    . acetaminophen (TYLENOL) tablet 325-650 mg  325-650 mg Oral Q6H PRN Marchia Bond, MD      . alum & mag hydroxide-simeth (MAALOX/MYLANTA) 200-200-20 MG/5ML suspension 30 mL  30 mL Oral Q4H PRN Marchia Bond, MD   30 mL at 02/24/18 1603  . amLODipine (NORVASC) tablet 5 mg  5 mg Oral Daily Elwyn Reach, MD   5 mg at 02/24/18 0941  . bisacodyl (DULCOLAX) suppository 10 mg  10 mg Rectal Daily PRN Marchia Bond, MD      . budesonide (PULMICORT) nebulizer solution 0.25 mg  0.25 mg Nebulization BID Gala Romney L, MD   0.25 mg at 02/24/18 2059  . ceFAZolin (ANCEF) IVPB 1 g/50 mL premix  1 g Intravenous Q8H Tyrone Apple, [facility]   Stopped at 02/25/18 0531  . docusate sodium (COLACE) capsule 100 mg  100 mg Oral BID Marchia Bond, MD   100 mg at 02/24/18 0942  . enoxaparin (LOVENOX) injection 30  mg  30 mg Subcutaneous Q24H Marchia Bond, MD   30 mg at 02/24/18 0941  . ferrous sulfate tablet 325 mg  325 mg Oral TID Emiliano Dyer, MD   325 mg at 02/24/18 1807  . HYDROcodone-acetaminophen (NORCO/VICODIN) 5-325 MG per tablet 1-2 tablet  1-2 tablet Oral Q4H PRN Marchia Bond, MD   2 tablet at 02/25/18 0455  . levETIRAcetam (KEPPRA) IVPB 500 mg/100 mL premix  500 mg Intravenous Q12H Greta Doom, MD      . levothyroxine (SYNTHROID, LEVOTHROID) tablet 88 mcg  88 mcg Oral QAC breakfast Elwyn Reach, MD   88 mcg at 02/24/18 0941  . LORazepam (ATIVAN) injection 1 mg  1 mg Intravenous Q2H PRN Ivor Costa, MD    1 mg at 02/24/18 2212  . magnesium citrate solution 1 Bottle  1 Bottle Oral Once PRN Marchia Bond, MD      . menthol-cetylpyridinium (CEPACOL) lozenge 3 mg  1 lozenge Oral PRN Marchia Bond, MD       Or  . phenol (CHLORASEPTIC) mouth spray 1 spray  1 spray Mouth/Throat PRN Marchia Bond, MD   1 spray at 02/24/18 1603  . metoCLOPramide (REGLAN) tablet 5-10 mg  5-10 mg Oral Q8H PRN Marchia Bond, MD       Or  . metoCLOPramide (REGLAN) injection 5-10 mg  5-10 mg Intravenous Q8H PRN Marchia Bond, MD      . morphine 2 MG/ML injection 0.5-1 mg  0.5-1 mg Intravenous Q2H PRN Marchia Bond, MD   1 mg at 02/24/18 1603  . ondansetron (ZOFRAN) tablet 4 mg  4 mg Oral Q6H PRN Marchia Bond, MD       Or  . ondansetron [facility]) injection 4 mg  4 mg Intravenous Q6H PRN Marchia Bond, MD   4 mg at 02/24/18 0305  . pantoprazole (PROTONIX) EC tablet 40 mg  40 mg Oral Daily Elwyn Reach, MD   40 mg at 02/24/18 0941  . polyethylene glycol (MIRALAX / GLYCOLAX) packet 17 g  17 g Oral Daily PRN Marchia Bond, MD      . tiotropium [facility]) inhalation capsule 18 mcg  18 mcg Inhalation Daily Elwyn Reach, MD   18 mcg at 02/24/18 4665     Discharge Medications: Please see discharge summary for a list of discharge medications.  Relevant Imaging Results:  Relevant Lab Results:   Additional Information SSN: [id number]  Eileen Stanford, LCSW

## 2018-02-26 LAB — CBC
HCT: 31.2 % — ABNORMAL LOW (ref 36.0–46.0)
Hemoglobin: 10.5 g/dL — ABNORMAL LOW (ref 12.0–15.0)
MCH: 31.6 pg (ref 26.0–34.0)
MCHC: 33.7 g/dL (ref 30.0–36.0)
MCV: 94 fL (ref 78.0–100.0)
PLATELETS: 143 10*3/uL — AB (ref 150–400)
RBC: 3.32 MIL/uL — AB (ref 3.87–5.11)
RDW: 12.8 % (ref 11.5–15.5)
WBC: 7.5 10*3/uL (ref 4.0–10.5)

## 2018-02-26 LAB — TYPE AND SCREEN
ABO/RH(D): O POS
ANTIBODY SCREEN: NEGATIVE

## 2018-02-26 LAB — ABO/RH: ABO/RH(D): O POS

## 2018-02-26 MED ORDER — FERROUS SULFATE 300 (60 FE) MG/5ML PO SYRP
300.0000 mg | ORAL_SOLUTION | Freq: Three times a day (TID) | ORAL | Status: DC
  Administered 2018-02-26 – 2018-02-28 (×7): 300 mg via ORAL
  Filled 2018-02-26 (×9): qty 5

## 2018-02-26 MED ORDER — DOCUSATE SODIUM 50 MG/5ML PO LIQD
100.0000 mg | Freq: Two times a day (BID) | ORAL | Status: DC
  Administered 2018-02-26 – 2018-02-28 (×4): 100 mg via ORAL
  Filled 2018-02-26 (×5): qty 10

## 2018-02-26 NOTE — Plan of Care (Signed)

## 2018-02-26 NOTE — Progress Notes (Signed)
    Subjective: Patient reports pain as mild to moderate, controlled.  OOB to chair.  Objective:   VITALS:   Vitals:   02/25/18 1145 02/25/18 2049 02/26/18 0633 02/26/18 0726  BP:  130/75 118/80   Pulse:  90 78   Resp:      Temp:  99 F (37.2 C) 98.6 F (37 C)   TempSrc:  Oral Axillary   SpO2: 96% 90% 95% 96%  Weight:      Height:       CBC Latest Ref Rng & Units 02/26/2018 02/25/2018 02/24/2018  WBC 4.0 - 10.5 K/uL 7.5 5.3 8.5  Hemoglobin 12.0 - 15.0 g/dL 10.5(L) 8.1(L) 10.6(L)  Hematocrit 36.0 - 46.0 % 31.2(L) 23.6(L) 31.3(L)  Platelets 150 - 400 K/uL 143(L) 85(L) 160   BMP Latest Ref Rng & Units 02/25/2018 02/24/2018 02/23/2018  Glucose 65 - 99 mg/dL 128(H) 151(H) 143(H)  BUN 6 - 20 mg/dL 12 16 13   Creatinine 0.44 - 1.00 mg/dL 0.71 0.97 0.70  Sodium 135 - 145 mmol/L 137 138 141  Potassium 3.5 - 5.1 mmol/L 3.6 3.9 2.9(L)  Chloride 101 - 111 mmol/L 108 108 106  CO2 22 - 32 mmol/L 21(L) 21(L) -  Calcium 8.9 - 10.3 mg/dL 7.7(L) 7.9(L) -   Intake/Output      04/19 0701 - 04/20 0700 04/20 0701 - 04/21 0700   P.O.     Total Intake(mL/kg)     Net             Physical Exam: General: NAD.  Operating bed.  Calm, conversant.  Family at bedside.  No increased work of breathing. MSK LUE: Hand warm. Movement of the finger is intact but significantly reduced.  Sensation intact but also reduced more in the fifth finger. All dressings taken down to skin and reapplied.  Pin sites stable.  Skin is poor quality with significant bruising.  No purulence or drainage. LLE: Neurovascularly intact Sensation intact distally Feet warm Dorsiflexion/Plantar flexion intact Incision: dressing C/D/I  Assessment: 3 Days Post-Op  S/P Procedure(s) (LRB): EXTERNAL FIXATION Left ARM, irrigation and debridement bone, complex wound closure (Left) CANNULATED LEFT HIP PINNING (Left) by Dr. Mardelle Matte on 02/23/2018  Principal Problem:   Fracture of femoral neck, left (HCC) Active Problems:  Fracture of distal radius and ulna, left, open type III, initial encounter   Hypokalemia   Femoral fracture (HCC)   Left femoral neck fracture, valgus impacted, status post cannulated hip pinning Stable. Pain controlled As tolerated OOB to chair  Open left distal radius/ulna fracture, status post external fixation, complex wound closure, I&D Stable.   Ex-fix in place.  Ace wrap was removed and replaced by nursing last night. Her skin and bone quality are both poor. Dressings brought down to skin today and replaced.  There is no purulence or active bleeding.     Plan:  DO NOT move left arm using ex-fix.  Support her whole arm. Mobilize w/ therapy Incentive Spirometry Elevate LUE  Weightbearing: WBAT LLE.  NWB LUE. Insicional and dressing care: Dressings left intact until follow-up Orthopedic device(s): Ex fix LUE Showering: Keep dressing dry VTE prophylaxis: Lovenox, SCDs, ambulation Pain control: Tylenol.  Norco as needed Follow - up plan: With Dr. Ophelia Charter in the office next week  Dispo: Skilled Nursing Facility/Rehab  Planning in progress.  Continue care per medicine team.   Prudencio Burly III, PA-C 02/26/2018, 10:18 AM

## 2018-02-26 NOTE — Progress Notes (Signed)
PROGRESS NOTE  Rebecca Petty  KDX:[id number] DOB: Sep 23, 1926 DOA: 02/23/2018 PCP: No primary care provider on file.   Brief Narrative: Rebecca Petty is an 84 y.o. female with a history of HTN, hypothyroidism, and GERD who tripped and fell onto concrete outside [facility], with resultant trauma to the left side of the body including open distal radius and ulna fractures, left femoral neck fracture, and left shoulder degloving injury as well as left facial/head contusion. She was brought to the ED and urgently taken for surgery 4/17 by Dr. Mardelle Matte. This witnessed fall was not accompanied by loss of consciousness. On 4/18 she had a seizure witnessed by her daughter lasting ~45 seconds. Neurology consulted, keppra started, MRI ordered but precluded by hardware. Previous CT head demonstrated no intracranial process, but small left frontal scalp hematoma. EEG was normal. Neurology recommended continuing AEDs and outpatient neurology follow up.  Assessment & Plan: Principal Problem:   Fracture of femoral neck, left (HCC) Active Problems:   Fracture of distal radius and ulna, left, open type III, initial encounter   Hypokalemia   Femoral fracture (HCC)  Mechanical fall with multiple trauma: No LOC/syncope. CT head showed atrophy, extensive microvascular ischemic changes and left anterior scalp hematoma without fracture/ICH.  - Management as below  Left femoral neck fracture: s/p closed reduction and percutaneous pin fixation.  - Per orthopedics, WBAT LLE, pain control as ordered - PT/OT, CSW consulted for SNF placement. FL2 signed, awaiting bed offers (discussed with CSW, do not anticipate bed offer until Monday).   Left open grade 3 distal radius and ulna fractures s/p open reduction and external fixation - Give 3 days abx, TDaP given - NWB LUE  Acute blood loss anemia post-operatively: Also with significant ecchymoses and now thrombocytopenia (though all cell lines have dropped).  - Hgb has  stabilized at 10.5, platelets also stabilized at 143. Suspect diluted specimen on 4/19. No further indication to check CBC.  Seizure: 4/18. Spontaneously resolved in 45 seconds. EEG did not capture epileptiform activity.  - Neurology recommended continuing keppra and outpatient follow up in 4-6 weeks.  - No MRI per orthopedics due to hardware.   Hypothyroidism: No clinical evidence of hypo/hyperthyroidism.  - On stable dose synthroid which will be continued.   HTN: At goal.  - Continue norvasc  Hypokalemia:  - Resolved with replacement. 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 No chest pain.  - Continue telemetry monitoring.   DVT prophylaxis: Loveonx Code Status: Full Family Communication: Daughters at bedside Disposition Plan: SNF, awaiting bed offers (discussed with CSW, do not anticipate bed offer until Monday).   Consultants:   Orthopedics, Dr. Mardelle Matte  Neurology, Dr. Rory Percy  Procedures:  PROCEDURE 02/23/2018:  SURGEON:  Johnny Bridge, MD  1.  Left distal radius and ulna open irrigation, debridement, skin, subcutaneous tissue, and bone 2.  Application of external fixator to the left distal radius going from metacarpal to radial shaft 3.  Complex wound closure, 15 cm open wound on the ulnar side of the distal forearm 4.  Repair of degloving skin injury to the left shoulder, 5 x 5 cm 5.  Closed reduction with percutaneous pin fixation left femoral neck  Antimicrobials:  Ancef 4/17 x3 days  Subjective: No further seizure episodes. Eating ok, pain controlled. Again denies dyspnea, chest pain, palpitations, lightheadedness, GI or vaginal bleeding.  Objective: Vitals:   02/25/18 1145 02/25/18 2049 02/26/18 0633 02/26/18 0726  BP:  130/75 118/80   Pulse:  90 78   Resp:      Temp:  99 F (37.2 C) 98.6 F (37 C)   TempSrc:  Oral Axillary   SpO2: 96%  90% 95% 96%  Weight:      Height:       No intake or output data in the 24 hours ending 02/26/18 1444 Filed Weights   02/23/18 1327  Weight: 54.4 kg (120 lb)    Gen: Pleasant, frail elderly female in no distress Pulm: Non-labored breathing. Clear to auscultation bilaterally.  CV: Regular rate and rhythm. No murmur, rub, or gallop. No JVD, no pedal edema. GI: Abdomen soft, non-tender, non-distended, with normoactive bowel sounds. No organomegaly or masses felt. Ext: Left arm externally fixated with cast and pins. Fingers with +AROM, SILT. RUE no deformity. LLE with dressing on hip c/d/i, soft compartment, toes with AROM, SILT, DP pulses 2+, symmetric.  Skin: Extensive ecchymoses on upper extremities without focal fluctuance/hematoma, seeming to be in usual evolution without advancement. Left scalp hematoma without crepitus, also stable. Developing left periocular ecchymosis. Left shoulder dressing c/d/i, not taken down, no extension of ecchymosis, dressing c/d/i.  Neuro: Alert and oriented but very forgetful and perseverates on certain facts. No focal neurological deficits. Psych: Judgement and insight appear fair. Mood & affect appropriate.   Data Reviewed: I have personally reviewed following labs and imaging studies  CBC: Recent Labs  Lab 02/23/18 1416 02/23/18 1434 02/24/18 1022 02/25/18 0948 02/26/18 0737  WBC 11.5*  --  8.5 5.3 7.5  HGB 14.2 13.9 10.6* 8.1* 10.5*  HCT 41.4 41.0 31.3* 23.6* 31.2*  MCV 91.8  --  92.6 93.7 94.0  PLT 166  --  160 85* 725*   Basic Metabolic Panel: Recent Labs  Lab 02/23/18 1416 02/23/18 1434 02/24/18 0646 02/25/18 0641  NA 140 141 138 137  K 2.9* 2.9* 3.9 3.6  CL 109 106 108 108  CO2 20*  --  21* 21*  GLUCOSE 144* 143* 151* 128*  BUN 11 13 16 12   CREATININE 0.70 0.70 0.97 0.71  CALCIUM 8.3*  --  7.9* 7.7*   GFR: Estimated Creatinine Clearance: 40.9 mL/min (by C-G formula based on SCr of 0.71 mg/dL). Liver Function Tests: Recent  Labs  Lab 02/23/18 1416 02/24/18 0646  AST 21 26  ALT 16 13*  ALKPHOS 88 67  BILITOT 1.0 0.8  PROT 6.1* 4.9*  ALBUMIN 3.7 2.9*   No results for input(s): LIPASE, AMYLASE in the last 168 hours. No results for input(s): AMMONIA in the last 168 hours. Coagulation Profile: Recent Labs  Lab 02/23/18 1416  INR 1.03   Cardiac Enzymes: No results for input(s): CKTOTAL, CKMB, CKMBINDEX, TROPONINI in the last 168 hours. BNP (last 3 results) No results for input(s): PROBNP in the last 8760 hours. HbA1C: No results for input(s): HGBA1C in the last 72 hours. CBG: Recent Labs  Lab 02/24/18 2134  GLUCAP 158*   Lipid Profile: No results for input(s): CHOL, HDL, LDLCALC, TRIG, CHOLHDL, LDLDIRECT in the last 72 hours. Thyroid Function Tests: No results for input(s): TSH, T4TOTAL, FREET4, T3FREE, THYROIDAB in the last 72 hours. Anemia Panel: No results for input(s): VITAMINB12, FOLATE, FERRITIN, TIBC, IRON, RETICCTPCT in the last 72 hours. Urine analysis:    Component Value Date/Time   COLORURINE YELLOW 02/23/2018 1510   APPEARANCEUR HAZY (A) 02/23/2018 1510   LABSPEC 1.009 02/23/2018 1510  PHURINE 6.0 02/23/2018 1510   GLUCOSEU NEGATIVE 02/23/2018 1510   HGBUR MODERATE (A) 02/23/2018 1510   BILIRUBINUR NEGATIVE 02/23/2018 1510   KETONESUR 5 (A) 02/23/2018 1510   PROTEINUR NEGATIVE 02/23/2018 1510   NITRITE NEGATIVE 02/23/2018 1510   LEUKOCYTESUR MODERATE (A) 02/23/2018 1510   No results found for this or any previous visit (from the past 240 hour(s)).    Radiology Studies: Ct Head Wo Contrast  Result Date: 02/24/2018 CLINICAL DATA:  New onset seizure.  History of hypertension. EXAM: CT HEAD WITHOUT CONTRAST TECHNIQUE: Contiguous axial images were obtained from the base of the skull through the vertex without intravenous contrast. COMPARISON:  CT HEAD February 23, 2018 FINDINGS: BRAIN: No intraparenchymal hemorrhage, mass effect nor midline shift. The ventricles and sulci are  normal for age. Confluent supratentorial white matter hypodensities. Patchy bilateral basal ganglia hypodensities. No acute large vascular territory infarcts. No abnormal extra-axial fluid collections. Basal cisterns are patent. VASCULAR: Moderate calcific atherosclerosis of the carotid siphons. SKULL: No skull fracture. Small LEFT frontal scalp hematoma, decreased from yesterday. SINUSES/ORBITS: Trace paranasal sinus mucosal thickening without air-fluid levels. Mastoid air cells are well aerated.The included ocular globes and orbital contents are non-suspicious. Status post bilateral ocular lens implants. OTHER: Patient is edentulous. IMPRESSION: 1. No acute intracranial process. Small residual LEFT frontal scalp hematoma. No skull fracture. Stable examination including severe chronic small vessel ischemic disease. Electronically Signed   By: Elon Alas M.D.   On: 02/24/2018 22:51    Scheduled Meds: . amLODipine  5 mg Oral Daily  . budesonide  0.25 mg Nebulization BID  . docusate sodium  100 mg Oral BID  . enoxaparin (LOVENOX) injection  30 mg Subcutaneous Q24H  . ferrous sulfate  300 mg Oral TID WC  . levothyroxine  88 mcg Oral QAC breakfast  . pantoprazole  40 mg Oral Daily  . tiotropium  18 mcg Inhalation Daily   Continuous Infusions: .  ceFAZolin (ANCEF) IV 1 g (02/26/18 1350)  . levETIRAcetam Stopped (02/26/18 0922)     LOS: 3 days   Time spent: 25 minutes.  Vance Gather, MD [facility] Pager [phone]  If 7PM-7AM, please contact night-coverage [email or web address] Password [facility] 02/26/2018, 2:44 PM

## 2018-02-26 NOTE — Progress Notes (Signed)
Neurology Progress Note  S:// No sz. No acute changes  O:// Current vital signs: BP 118/80 (BP Location: Right Arm)   Pulse 78   Temp 98.6 F (37 C) (Axillary)   Resp 18   Ht 5\' 5"  (1.651 m)   Wt 54.4 kg (120 lb)   SpO2 96%   BMI 19.97 kg/m  Vital signs in last 24 hours: Temp:  [98.6 F (37 C)-99 F (37.2 C)] 98.6 F (37 C) (04/20 6503) Pulse Rate:  [78-90] 78 (04/20 0633) BP: (118-130)/(75-80) 118/80 (04/20 0633) SpO2:  [90 %-96 %] 96 % (04/20 0726) HEENT-  Hematoma on left side of face   Cardiovascular-  pulses palpable throughout   Lungs-no rhonchi or wheezing noted, no excessive working breathing.  Saturations within normal limits Abdomen- All 4 quadrants palpated and nontender Extremities- Warm, dry and intact--with external fixator on the left arm along with major bruising of left arm  Musculoskeletal- tenderness,  Swelling left arm from fall Skin-warm and dry, major bruising of left arm  Neuro: Mental Status: awake, oriented to hospital.  Speech fluent without evidence of aphasia.  Able to follow 3 step commands without difficulty.  Cranial Nerves: II:  Visual fields grossly normal,  III,IV, VI: ptosis not present, extra-ocular motions intact bilaterally pupils equal, round, reactive to light and accommodation V,VII: smile symmetric, facial light touch sensation normal bilaterally VIII: hearing decreased IX,X: uvula rises symmetrically XI: bilateral shoulder shrug XII: midline tongue extension Motor: Left arm in external fixator and painful to move. Right arm 5/5, left leg able to move 1/5 at knee and 3/5 at ankle but hip is broken and has pain throughout.  Sensory: Pinprick and light touch intact throughout, bilaterally Cerebellar: intact FNF right.  Medications  Current Facility-Administered Medications:  .  acetaminophen (TYLENOL) tablet 325-650 mg, 325-650 mg, Oral, Q6H PRN, Marchia Bond, MD .  alum & mag hydroxide-simeth (MAALOX/MYLANTA) 200-200-20  MG/5ML suspension 30 mL, 30 mL, Oral, Q4H PRN, Marchia Bond, MD, 30 mL at 02/24/18 1603 .  amLODipine (NORVASC) tablet 5 mg, 5 mg, Oral, Daily, Jonelle Sidle, Mohammad L, MD, 5 mg at 02/25/18 1206 .  bisacodyl (DULCOLAX) suppository 10 mg, 10 mg, Rectal, Daily PRN, Marchia Bond, MD .  budesonide (PULMICORT) nebulizer solution 0.25 mg, 0.25 mg, Nebulization, BID, Jonelle Sidle, Mohammad L, MD, 0.25 mg at 02/26/18 0725 .  ceFAZolin (ANCEF) IVPB 1 g/50 mL premix, 1 g, Intravenous, Q8H, Dang, Thuy D, RPH, Last Rate: 100 mL/hr at 02/26/18 0607, 1 g at 02/26/18 0607 .  docusate sodium (COLACE) capsule 100 mg, 100 mg, Oral, BID, Marchia Bond, MD, 100 mg at 02/25/18 1207 .  enoxaparin (LOVENOX) injection 30 mg, 30 mg, Subcutaneous, Q24H, Marchia Bond, MD, 30 mg at 02/25/18 1206 .  ferrous sulfate tablet 325 mg, 325 mg, Oral, TID PC, Marchia Bond, MD, 325 mg at 02/25/18 1826 .  HYDROcodone-acetaminophen (NORCO/VICODIN) 5-325 MG per tablet 1-2 tablet, 1-2 tablet, Oral, Q4H PRN, Marchia Bond, MD, 1 tablet at 02/25/18 1625 .  levETIRAcetam (KEPPRA) IVPB 500 mg/100 mL premix, 500 mg, Intravenous, Q12H, Greta Doom, MD, Stopped at 02/25/18 2216 .  levothyroxine (SYNTHROID, LEVOTHROID) tablet 88 mcg, 88 mcg, Oral, QAC breakfast, Elwyn Reach, MD, 88 mcg at 02/25/18 1206 .  LORazepam (ATIVAN) injection 1 mg, 1 mg, Intravenous, Q2H PRN, Ivor Costa, MD, 1 mg at 02/24/18 2212 .  magnesium citrate solution 1 Bottle, 1 Bottle, Oral, Once PRN, Marchia Bond, MD .  menthol-cetylpyridinium (CEPACOL) lozenge 3 mg, 1 lozenge, Oral,  PRN **OR** phenol (CHLORASEPTIC) mouth spray 1 spray, 1 spray, Mouth/Throat, PRN, Marchia Bond, MD, 1 spray at 02/24/18 1603 .  metoCLOPramide (REGLAN) tablet 5-10 mg, 5-10 mg, Oral, Q8H PRN **OR** metoCLOPramide (REGLAN) injection 5-10 mg, 5-10 mg, Intravenous, Q8H PRN, Marchia Bond, MD .  morphine 2 MG/ML injection 0.5-1 mg, 0.5-1 mg, Intravenous, Q2H PRN, Marchia Bond, MD, 1 mg  at 02/25/18 2226 .  ondansetron (ZOFRAN) tablet 4 mg, 4 mg, Oral, Q6H PRN **OR** ondansetron (ZOFRAN) injection 4 mg, 4 mg, Intravenous, Q6H PRN, Marchia Bond, MD, 4 mg at 02/25/18 2008 .  pantoprazole (PROTONIX) EC tablet 40 mg, 40 mg, Oral, Daily, Jonelle Sidle, Mohammad L, MD, 40 mg at 02/25/18 1206 .  polyethylene glycol (MIRALAX / GLYCOLAX) packet 17 g, 17 g, Oral, Daily PRN, Marchia Bond, MD .  tiotropium [facility]) inhalation capsule 18 mcg, 18 mcg, Inhalation, Daily, Gala Romney L, MD, 18 mcg at 02/26/18 0726 Labs CBC    Component Value Date/Time   WBC 7.5 02/26/2018 0737   RBC 3.32 (L) 02/26/2018 0737   HGB 10.5 (L) 02/26/2018 0737   HCT 31.2 (L) 02/26/2018 0737   PLT 143 (L) 02/26/2018 0737   MCV 94.0 02/26/2018 0737   MCH 31.6 02/26/2018 0737   MCHC 33.7 02/26/2018 0737   RDW 12.8 02/26/2018 0737   CMP     Component Value Date/Time   NA 137 02/25/2018 0641   K 3.6 02/25/2018 0641   CL 108 02/25/2018 0641   CO2 21 (L) 02/25/2018 0641   GLUCOSE 128 (H) 02/25/2018 0641   BUN 12 02/25/2018 0641   CREATININE 0.71 02/25/2018 0641   CALCIUM 7.7 (L) 02/25/2018 0641   PROT 4.9 (L) 02/24/2018 0646   ALBUMIN 2.9 (L) 02/24/2018 0646   AST 26 02/24/2018 0646   ALT 13 (L) 02/24/2018 0646   ALKPHOS 67 02/24/2018 0646   BILITOT 0.8 02/24/2018 0646   GFRNONAA >60 02/25/2018 0641   GFRAA >60 02/25/2018 0641   Imaging I have reviewed images in epic and the results pertinent to this consultation are: CT-scan of the brain- no acute process. Small left frontal scalp hematoma.  EEG did not reveal abnormality. A normal EEG does not rule out seizures.  Assessment:  89/F new onset seizure in the setting of recent fall. Possible lowering of seizure threshold due to underlying dementia and physiological stressor. Will recommend to continue AEDs as there might be predisposition for seizures due to underlying cognitive impairment..  Impression: New onset  seizure Dementia  Recommendations: Seizure precautions Keppra 500 BID Outpatient Neurology f/u in 4-6 weeks. Call us with questions as needed.  -- Amie Portland, MD [facility] Pager: [phone] If 7pm to 7am, please call on call as listed on AMION.

## 2018-02-27 MED ORDER — LEVETIRACETAM 500 MG PO TABS
500.0000 mg | ORAL_TABLET | Freq: Two times a day (BID) | ORAL | Status: DC
  Filled 2018-02-27: qty 1

## 2018-02-27 MED ORDER — LEVETIRACETAM 100 MG/ML PO SOLN
500.0000 mg | Freq: Two times a day (BID) | ORAL | Status: DC
  Administered 2018-02-27 – 2018-02-28 (×2): 500 mg via ORAL
  Filled 2018-02-27 (×3): qty 5

## 2018-02-27 NOTE — Progress Notes (Addendum)
Occupational Therapy Treatment Patient Details Name: Rebecca Petty MRN: [id number] DOB: 27-Jul-1926 Today's Date: 02/27/2018    History of present illness Pt is an 84 y/o female who presents s/p fall, sustaining an open grade 3 left distal radius and ulna fracture, L femoral neck shaft fracture, and shoulder skin tear measuring 5x5 cm. Pt is now s/p external fixator to the L distal radius, repair of degloving skin injury to the L shoulder, and closed reduction with percutaneous pin fixation of the L femoral neck. Pt is currently NWB to the L wrist, WBAT through the L elbow, and WBAT through the LLE. PMH significant for thyroid disease, HTN. 4/18 Pt had a grand mal seizure and now has seizure precautions.    OT comments  Pt progressing towards OT goals. Pt with improved cognition since last seen by therapy. Focus of session was ROM for LUE. Digits are now all moving with AROM to some extent. Index finger and thumb have approx 80% ROM, 4&5th digits are able to move at 20% ROM. AAROM from L shoulder to pain tolerance (approx 20 degrees). OT also assisted RN with reposition in bed. OT did reposition LUE with more elevation and Pt reported that it "felt better"  OT will continue to follow in the acute setting prior to dc to SNF.    Follow Up Recommendations  SNF;Supervision/Assistance - 24 hour    Equipment Recommendations  Other (comment)(defer to next venue)    Recommendations for Other Services      Precautions / Restrictions Precautions Precautions: Fall Precaution Comments: Ex-fix on L wrist; very thin/fragile skin; DO NOT move left arm using ex-fix. (Support her whole arm.) Restrictions Weight Bearing Restrictions: Yes LUE Weight Bearing: Weight bear through elbow only LLE Weight Bearing: Weight bearing as tolerated       Mobility Bed Mobility Overal bed mobility: Needs Assistance Bed Mobility: Rolling Rolling: Max assist;+2 for safety/equipment         General bed mobility  comments: Assisted RN for rolling in bed for weight re-distribution/repositioning  Transfers                 General transfer comment: NT this session    Balance                                           ADL either performed or assessed with clinical judgement   ADL Overall ADL's : Needs assistance/impaired Eating/Feeding: Set up;Minimal assistance;Sitting Eating/Feeding Details (indicate cue type and reason): Pt is typically left handed, and has been completing feeding with her right hand                                   General ADL Comments: focus of session was ROM in LUE/exercises     Vision       Perception     Praxis      Cognition Arousal/Alertness: Awake/alert Behavior During Therapy: WFL for tasks assessed/performed Overall Cognitive Status: Impaired/Different from baseline Area of Impairment: Attention;Memory;Safety/judgement                   Current Attention Level: Alternating Memory: Decreased recall of precautions;Decreased short-term memory   Safety/Judgement: Decreased awareness of safety;Decreased awareness of deficits Awareness: Emergent   General Comments: Pt asking multiple times during session about what happened  to hip, did not remember where she was when she had the fall.        Exercises Exercises: General Upper Extremity General Exercises - Upper Extremity Shoulder Flexion: AAROM;Right;10 reps;Supine(HOB elevated) Shoulder Horizontal ABduction: AAROM;10 reps;Left;Supine(approx 20 degrees; to pain tolerance) Digit Composite Flexion: AROM;Left;10 reps;Supine(Pt able to actively move all digits at this time) Composite Extension: AAROM;10 reps;Left   Shoulder Instructions       General Comments 2 daughters and 1 granddaughter present during session    Pertinent Vitals/ Pain       Pain Assessment: Faces Faces Pain Scale: Hurts even more Pain Location: L wrist and L hip Pain Descriptors /  Indicators: Operative site guarding;Aching;Sore Pain Intervention(s): Monitored during session;Limited activity within patient's tolerance;Repositioned;RN gave pain meds during session  Home Living                                          Prior Functioning/Environment              Frequency  Min 3X/week        Progress Toward Goals  OT Goals(current goals can now be found in the care plan section)  Progress towards OT goals: Progressing toward goals  Acute Rehab OT Goals Patient Stated Goal: less pain; regain independence  OT Goal Formulation: With patient/family Time For Goal Achievement: 03/10/18 Potential to Achieve Goals: Good  Plan Discharge plan remains appropriate    Co-evaluation                 AM-PAC PT "6 Clicks" Daily Activity     Outcome Measure   Help from another person eating meals?: A Little Help from another person taking care of personal grooming?: A Little Help from another person toileting, which includes using toliet, bedpan, or urinal?: Total Help from another person bathing (including washing, rinsing, drying)?: A Lot Help from another person to put on and taking off regular upper body clothing?: A Lot Help from another person to put on and taking off regular lower body clothing?: Total 6 Click Score: 12    End of Session    OT Visit Diagnosis: Other abnormalities of gait and mobility (R26.89);Muscle weakness (generalized) (M62.81);History of falling (Z91.81);Pain Pain - Right/Left: Left Pain - part of body: Hand;Hip   Activity Tolerance Patient tolerated treatment well   Patient Left in bed;with call bell/phone within reach;with bed alarm set;with family/visitor present;with nursing/sitter in room   Nurse Communication Mobility status        Time: 8144-8185 OT Time Calculation (min): 25 min  Charges: OT General Charges $OT Visit: 1 Visit OT Treatments $Therapeutic Exercise: 8-22 mins  Hulda Humphrey  OTR/L [place] 02/27/2018, 4:15 PM

## 2018-02-27 NOTE — Progress Notes (Signed)
    Subjective: Patient reports pain as mild, controlled.  OOB to chair.  Objective:   VITALS:   Vitals:   02/26/18 1547 02/26/18 1941 02/26/18 1956 02/27/18 0423  BP: 128/71  140/88 114/61  Pulse: 77  75 67  Resp: 16     Temp: 98.3 F (36.8 C)  98.4 F (36.9 C) 98.4 F (36.9 C)  TempSrc: Oral  Oral   SpO2: 100% 92% 90% (!) 87%  Weight:      Height:       CBC Latest Ref Rng & Units 02/26/2018 02/25/2018 02/24/2018  WBC 4.0 - 10.5 K/uL 7.5 5.3 8.5  Hemoglobin 12.0 - 15.0 g/dL 10.5(L) 8.1(L) 10.6(L)  Hematocrit 36.0 - 46.0 % 31.2(L) 23.6(L) 31.3(L)  Platelets 150 - 400 K/uL 143(L) 85(L) 160   BMP Latest Ref Rng & Units 02/25/2018 02/24/2018 02/23/2018  Glucose 65 - 99 mg/dL 128(H) 151(H) 143(H)  BUN 6 - 20 mg/dL 12 16 13   Creatinine 0.44 - 1.00 mg/dL 0.71 0.97 0.70  Sodium 135 - 145 mmol/L 137 138 141  Potassium 3.5 - 5.1 mmol/L 3.6 3.9 2.9(L)  Chloride 101 - 111 mmol/L 108 108 106  CO2 22 - 32 mmol/L 21(L) 21(L) -  Calcium 8.9 - 10.3 mg/dL 7.7(L) 7.9(L) -   Intake/Output      04/20 0701 - 04/21 0700 04/21 0701 - 04/22 0700   IV Piggyback 600    Total Intake(mL/kg) 600 (11)    Urine (mL/kg/hr) 800 (0.6)    Total Output 800    Net -200            Physical Exam: General: NAD.  Upright in bed.  Calm, conversant.   No increased work of breathing. MSK LUE: Hand warm. Movement of the finger is intact but significantly reduced.  Sensation intact. LLE: Neurovascularly intact Sensation intact distally Feet warm Dorsiflexion/Plantar flexion intact Incision: dressing C/D/I  Assessment: 4 Days Post-Op  S/P Procedure(s) (LRB): EXTERNAL FIXATION Left ARM, irrigation and debridement bone, complex wound closure (Left) CANNULATED LEFT HIP PINNING (Left) by Dr. Mardelle Matte on 02/23/2018  Principal Problem:   Fracture of femoral neck, left (HCC) Active Problems:   Fracture of distal radius and ulna, left, open type Petty, initial encounter   Hypokalemia   Femoral fracture  (HCC)   Left femoral neck fracture, valgus impacted, status post cannulated hip pinning Stable. Pain controlled As tolerated OOB to chair  Open left distal radius/ulna fracture, status post external fixation, complex wound closure, I&D Stable.   Ex-fix in place.   Her skin and bone quality are both poor. Dressings changed 02/26/18  Plan:  DO NOT move left arm using ex-fix.  Support her whole arm. Mobilize w/ therapy Incentive Spirometry Elevate LUE  Weightbearing: WBAT LLE.  NWB LUE. Insicional and dressing care: Dressings left intact until follow-up Orthopedic device(s): Ex fix LUE Showering: Keep dressing dry VTE prophylaxis: Lovenox, SCDs, ambulation Pain control: Tylenol.  Norco as needed.  Limit narcotics. Follow - up plan: With Dr. Ophelia Charter in the office next week  Dispo: Skilled Nursing Facility/Rehab  Planning in progress.  Continue care per medicine team.   Rebecca Burly III, PA-C 02/27/2018, 7:40 AM

## 2018-02-27 NOTE — Progress Notes (Signed)
PROGRESS NOTE  Rebecca Petty  WCB:[id number] DOB: 1926/08/25 DOA: 02/23/2018 PCP: No primary care provider on file.   Brief Narrative: Rebecca Petty is an 84 y.o. female with a history of HTN, hypothyroidism, and GERD who tripped and fell onto concrete outside [facility], with resultant trauma to the left side of the body including open distal radius and ulna fractures, left femoral neck fracture, and left shoulder degloving injury as well as left facial/head contusion. She was brought to the ED and urgently taken for surgery 4/17 by Dr. Mardelle Matte. This witnessed fall was not accompanied by loss of consciousness. On 4/18 she had a seizure witnessed by her daughter lasting ~45 seconds. Neurology consulted, keppra started, MRI ordered but precluded by hardware. Previous CT head demonstrated no intracranial process, but small left frontal scalp hematoma. EEG was normal. Neurology recommended continuing AEDs and outpatient neurology follow up.  Assessment & Plan: Principal Problem:   Fracture of femoral neck, left (HCC) Active Problems:   Fracture of distal radius and ulna, left, open type III, initial encounter   Hypokalemia   Femoral fracture (HCC)  Mechanical fall with multiple trauma: No LOC/syncope. CT head showed atrophy, extensive microvascular ischemic changes and left anterior scalp hematoma without fracture/ICH.  - Management as below  Left femoral neck fracture: s/p closed reduction and percutaneous pin fixation.  - Per orthopedics, WBAT LLE, pain control as ordered - PT/OT, CSW consulted for SNF placement. FL2 signed, awaiting bed offers (discussed with CSW, do not anticipate bed offer until Monday).   Left open grade 3 distal radius and ulna fractures s/p open reduction and external fixation - Given 3 days abx, TDaP given - NWB LUE  Acute blood loss anemia post-operatively: Also with significant ecchymoses and now thrombocytopenia (though all cell lines have dropped).  - Hgb has  stabilized at 10.5, platelets also stabilized at 143. Suspect diluted specimen on 4/19. No further indication to check CBC.  Seizure: 4/18. Spontaneously resolved in 45 seconds. EEG did not capture epileptiform activity.  - Neurology recommended continuing keppra and outpatient follow up in 4-6 weeks.  - No MRI per orthopedics due to hardware. Would consider this as outpatient following hardware removal.  Hypothyroidism: No clinical evidence of hypo/hyperthyroidism.  - On stable dose synthroid which will be continued.   HTN: At goal.  - Continue norvasc, no hypotension.  Hypokalemia:  - Resolved with replacement. Ok to stop IV fluids as she's taking good po.   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 No chest pain.  - Continue telemetry monitoring. Only showing intermittent 1st deg AV block. Ok to DC tele.   Cough: Suspect from atelectasis.  - Trained family and pt in use of incentive spirometry daily.  - Consider CXR if unable to wean from oxygen.   DVT prophylaxis: Lovenox Code Status: Full Family Communication: Daughters at bedside Disposition Plan: SNF, awaiting bed offers (discussed with CSW, do not anticipate bed offer until Monday).   Consultants:   Orthopedics, Dr. Mardelle Matte  Neurology, Dr. Rory Percy  Procedures:  PROCEDURE 02/23/2018:  SURGEON:  Johnny Bridge, MD  1.  Left distal radius and ulna open irrigation, debridement, skin, subcutaneous tissue, and bone 2.  Application of external fixator to the left distal radius going from metacarpal to radial shaft 3.  Complex wound closure, 15 cm open wound on the ulnar side of the distal forearm 4.  Repair of degloving skin injury to the left shoulder,  5 x 5 cm 5.  Closed reduction with percutaneous pin fixation left femoral neck  Antimicrobials:  Ancef 4/17 x3 days  Subjective: No further seizure episodes. Eating ok, pain controlled. Has had intermittent cough without  chest pain, dyspnea, sputum, wheezing or fever. Not getting around much and has not used IS today.   Objective: Vitals:   02/26/18 1941 02/26/18 1956 02/27/18 0423 02/27/18 0826  BP:  140/88 114/61   Pulse:  75 67   Resp:      Temp:  98.4 F (36.9 C) 98.4 F (36.9 C)   TempSrc:  Oral    SpO2: 92% 90% (!) 87% 96%  Weight:      Height:        Intake/Output Summary (Last 24 hours) at 02/27/2018 1533 Last data filed at 02/27/2018 1500 Gross per 24 hour  Intake 480 ml  Output 800 ml  Net -320 ml   Filed Weights   02/23/18 1327  Weight: 54.4 kg (120 lb)    Gen: Pleasant, frail elderly female in no distress Pulm: Non-labored breathing. Bibasilar crackles with deep inspiration that clears with subsequent breathing efforts. CV: Regular rate and rhythm. No murmur, rub, or gallop. No JVD, no pedal edema. GI: Abdomen soft, non-tender, non-distended, with normoactive bowel sounds. No organomegaly or masses felt. Ext: Left arm externally fixated with cast and pins. Fingers with +AROM, SILT. RUE no deformity. LLE with dressing on hip c/d/i, soft compartment, toes with AROM, SILT, DP pulses 2+, symmetric.  Skin: Extensive ecchymoses on upper extremities without focal fluctuance/hematoma, seeming to be in usual evolution without advancement. Left scalp hematoma without crepitus, also stable. Developing left periocular ecchymosis. Left shoulder dressing c/d/i, not taken down, no extension of ecchymosis, dressing c/d/i.  Neuro: Alert and oriented but very forgetful and perseverates on certain facts. No focal neurological deficits. Psych: Judgement and insight appear fair. Mood & affect appropriate.   Data Reviewed: I have personally reviewed following labs and imaging studies  CBC: Recent Labs  Lab 02/23/18 1416 02/23/18 1434 02/24/18 1022 02/25/18 0948 02/26/18 0737  WBC 11.5*  --  8.5 5.3 7.5  HGB 14.2 13.9 10.6* 8.1* 10.5*  HCT 41.4 41.0 31.3* 23.6* 31.2*  MCV 91.8  --  92.6 93.7  94.0  PLT 166  --  160 85* 259*   Basic Metabolic Panel: Recent Labs  Lab 02/23/18 1416 02/23/18 1434 02/24/18 0646 02/25/18 0641  NA 140 141 138 137  K 2.9* 2.9* 3.9 3.6  CL 109 106 108 108  CO2 20*  --  21* 21*  GLUCOSE 144* 143* 151* 128*  BUN 11 13 16 12   CREATININE 0.70 0.70 0.97 0.71  CALCIUM 8.3*  --  7.9* 7.7*   GFR: Estimated Creatinine Clearance: 40.9 mL/min (by C-G formula based on SCr of 0.71 mg/dL). Liver Function Tests: Recent Labs  Lab 02/23/18 1416 02/24/18 0646  AST 21 26  ALT 16 13*  ALKPHOS 88 67  BILITOT 1.0 0.8  PROT 6.1* 4.9*  ALBUMIN 3.7 2.9*   No results for input(s): LIPASE, AMYLASE in the last 168 hours. No results for input(s): AMMONIA in the last 168 hours. Coagulation Profile: Recent Labs  Lab 02/23/18 1416  INR 1.03   Cardiac Enzymes: No results for input(s): CKTOTAL, CKMB, CKMBINDEX, TROPONINI in the last 168 hours. BNP (last 3 results) No results for input(s): PROBNP in the last 8760 hours. HbA1C: No results for input(s): HGBA1C in the last 72 hours. CBG: Recent Labs  Lab 02/24/18  2134  GLUCAP 158*   Lipid Profile: No results for input(s): CHOL, HDL, LDLCALC, TRIG, CHOLHDL, LDLDIRECT in the last 72 hours. Thyroid Function Tests: No results for input(s): TSH, T4TOTAL, FREET4, T3FREE, THYROIDAB in the last 72 hours. Anemia Panel: No results for input(s): VITAMINB12, FOLATE, FERRITIN, TIBC, IRON, RETICCTPCT in the last 72 hours. Urine analysis:    Component Value Date/Time   COLORURINE YELLOW 02/23/2018 1510   APPEARANCEUR HAZY (A) 02/23/2018 1510   LABSPEC 1.009 02/23/2018 1510   PHURINE 6.0 02/23/2018 1510   GLUCOSEU NEGATIVE 02/23/2018 1510   HGBUR MODERATE (A) 02/23/2018 1510   BILIRUBINUR NEGATIVE 02/23/2018 1510   KETONESUR 5 (A) 02/23/2018 1510   PROTEINUR NEGATIVE 02/23/2018 1510   NITRITE NEGATIVE 02/23/2018 1510   LEUKOCYTESUR MODERATE (A) 02/23/2018 1510   No results found for this or any previous visit  (from the past 240 hour(s)).    Radiology Studies: No results found.  Scheduled Meds: . amLODipine  5 mg Oral Daily  . budesonide  0.25 mg Nebulization BID  . docusate  100 mg Oral BID  . enoxaparin (LOVENOX) injection  30 mg Subcutaneous Q24H  . ferrous sulfate  300 mg Oral TID WC  . levothyroxine  88 mcg Oral QAC breakfast  . pantoprazole  40 mg Oral Daily  . tiotropium  18 mcg Inhalation Daily   Continuous Infusions: . levETIRAcetam Stopped (02/27/18 0939)     LOS: 4 days   Time spent: 25 minutes.  Vance Gather, MD [facility] Pager [phone]  If 7PM-7AM, please contact night-coverage [email or web address] Password [facility] 02/27/2018, 3:33 PM

## 2018-02-28 DIAGNOSIS — D62 Acute posthemorrhagic anemia: Secondary | ICD-10-CM

## 2018-02-28 MED ORDER — HYDROCODONE-ACETAMINOPHEN 5-325 MG PO TABS: 1.0000 | ORAL_TABLET | Freq: Four times a day (QID) | ORAL | 0 refills | Status: AC | PRN

## 2018-02-28 NOTE — Social Work (Addendum)
CSW called SNF[facility] and left message to see if they can offer a SNF bed. CSW will f/u as patient discharging today.  CSW will f/u.  11:30am CSW discussed disposition with patient and daughter, Earlie Server, about plan to go to [facility] today. Daughter not pleased that medical staff did not alert her to discharge today. CSW validated and explained role of CSW and the SNF process.  CSW advised that daughter can call floor and get the medical questions answered that she asked CSW. Daughter wants transport to be scheduled at about 4:30pm.  CSW f/u for disposition.  Elissa Hefty, LCSW Clinical Social Worker [phone]

## 2018-02-28 NOTE — Progress Notes (Signed)
Pt being transported to SNF via [facility].  Education and dc information given to pt and daughter at bedside, both verbalized understanding.

## 2018-02-28 NOTE — Care Management Important Message (Signed)
Important Message  Patient Details  Name: Rebecca Petty MRN: [id number] Date of Birth: Mar 03, 1926   Medicare Important Message Given:  No  Due to the illness patient not able to sign /Unsigned copy left   Rolando Hessling 02/28/2018, 1:15 PM

## 2018-02-28 NOTE — Social Work (Addendum)
Clinical Social Worker facilitated patient discharge including contacting patient family and facility to confirm patient discharge plans.  Clinical information faxed to facility and family agreeable with plan.    CSW arranged ambulance transport via [facility] to [facility].    RN to call [phone] to give report prior to discharge.  Pt going to Room 606.  Clinical Social Worker will sign off for now as social work intervention is no longer needed. Please consult [place] again if new need arises.  Elissa Hefty, LCSW Clinical Social Worker [phone]

## 2018-02-28 NOTE — Care Management Important Message (Signed)
Important Message  Patient Details  Name: Rebecca Petty MRN: [id number] Date of Birth: 1926-02-18   Medicare Important Message Given:  Yes    Gurbani Figge 02/28/2018, 1:25 PM

## 2018-02-28 NOTE — Discharge Summary (Signed)
Physician Discharge Summary  Rebecca Petty ASN:[id number] DOB: 1925-11-14 DOA: 02/23/2018  PCP: No primary care provider on file.  Admit date: 02/23/2018 Discharge date: 02/28/2018  Admitted From: Home Disposition: SNF   Recommendations for Outpatient Follow-up:  1. Follow up with PCP in 1-2 weeks 2. Follow up with orthopedics, Dr. Griffin Basil in the next week. Full recommendations for postop care below 3. Please obtain CBC in one week  Home Health: N/A Equipment/Devices: Per SNF Discharge Condition: Stable CODE STATUS: Full Diet recommendation: Regular  Brief/Interim Summary: Rebecca Petty is an 84 y.o. female with a history of HTN, hypothyroidism, and GERD who tripped and fell onto concrete outside [facility], with resultant trauma to the left side of the body including open distal radius and ulna fractures, left femoral neck fracture, and left shoulder degloving injury as well as left facial/head contusion. She was brought to the ED and urgently taken for surgery 4/17 by Dr. Mardelle Matte. This witnessed fall was not accompanied by loss of consciousness. On 4/18 she had a seizure witnessed by her daughter lasting ~45 seconds. Neurology consulted, keppra started, MRI ordered but precluded by hardware. Previous CT head demonstrated no intracranial process, but small left frontal scalp hematoma. EEG was normal. Neurology recommended continuing AEDs and outpatient neurology follow up. Postoperative course was complicated otherwise only by expected ABLA which is stable and will need to be rechecked.   Discharge Diagnoses:  Principal Problem:   Fracture of femoral neck, left (HCC) Active Problems:   Fracture of distal radius and ulna, left, open type III, initial encounter   Hypokalemia   Femoral fracture (HCC)  Mechanical fall with multiple trauma: No LOC/syncope. CT head showed atrophy, extensive microvascular ischemic changes and left anterior scalp hematoma without fracture/ICH.  - Management as  below  Left femoral neck fracture: s/p closed reduction and percutaneous pin fixation.  - Per orthopedics, WBAT LLE, pain control as ordered.  - Per orthopedics, lovenox daily for DVT ppx until follow up with orthopedics, Dr. Griffin Basil as outpatient in the next week. Dressings left intact until follow up. Keep dry.  - PT/OT, CSW consulted for SNF placement.  Left open grade 3 distal radius and ulna fractures s/p open reduction and external fixation - Given 3 days ancef, TDaP given - NWB LUE  Acute blood loss anemia post-operatively: Also with significant ecchymoses and now thrombocytopenia (though all cell lines have dropped).  - Hgb has stabilized at 10.5, platelets also stabilized at 143. Suspect diluted specimen on 4/19.  - Recheck CBC in the next week.   Seizure: 4/18. Spontaneously resolved in 45 seconds. EEG did not capture epileptiform activity.  - Neurology recommended continuing keppra and outpatient follow up in 4-6 weeks.  - No MRI per orthopedics due to hardware. Would consider this as outpatient following hardware removal.  Hypothyroidism: No clinical evidence of hypo/hyperthyroidism.  - On stable dose synthroid which will be continued.   HTN: At goal.  - Continue norvasc, no hypotension.  Hypokalemia:  - Resolved with replacement. 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 No chest pain. Telemetry monitoring only showed intermittent 1st deg AV block.   Cough: Suspect from atelectasis. Not hypoxic.  - Trained family and pt in use of incentive spirometry daily.   Discharge Instructions Discharge Instructions    Ambulatory referral to Neurology   Complete by:  As directed    An appointment is requested in approximately: 8 weeks for  seizure   Diet general   Complete by:  As directed    Increase activity slowly   Complete by:  As directed    Non weight bearing   Complete by:  As directed     Through the wrist and hand   Laterality:  left   Extremity:  Upper   Weight bearing as tolerated   Complete by:  As directed    Ok to WB through left elbow,   Laterality:  left   Extremity:  Lower     Allergies as of 02/28/2018   No Known Allergies     Medication List    TAKE these medications   acetaminophen 325 MG tablet Commonly known as:  TYLENOL Take 2 tablets (650 mg total) by mouth every 6 (six) hours as needed.   amLODipine 5 MG tablet Commonly known as:  NORVASC Take 5 mg by mouth daily.   enoxaparin 30 MG/0.3ML injection Commonly known as:  LOVENOX Inject 0.3 mLs (30 mg total) into the skin daily.   fluticasone 220 MCG/ACT inhaler Commonly known as:  FLOVENT HFA Inhale 1-2 puffs into the lungs 2 (two) times daily.   HYDROcodone-acetaminophen 5-325 MG tablet Commonly known as:  NORCO/VICODIN Take 1 tablet by mouth every 6 (six) hours as needed for up to 5 days (mod-severe pain).   levothyroxine 88 MCG tablet Commonly known as:  SYNTHROID, LEVOTHROID Take 88 mcg by mouth daily.   omeprazole 40 MG capsule Commonly known as:  PRILOSEC Take 40 mg by mouth daily.   sennosides-docusate sodium 8.6-50 MG tablet Commonly known as:  SENOKOT-S Take 2 tablets by mouth daily.   SPIRIVA HANDIHALER 18 MCG inhalation capsule Generic drug:  tiotropium Place 18 mcg into inhaler and inhale daily.            Discharge Care Instructions  (From admission, onward)        Start     Ordered   02/23/18 0000  Weight bearing as tolerated    Comments:  Ok to [facility] through left elbow,  Question Answer Comment  Laterality left   Extremity Lower      02/23/18 2234   02/23/18 0000  Non weight bearing    Comments:  Through the wrist and hand  Question Answer Comment  Laterality left   Extremity Upper      02/23/18 2234     Follow-up Information    Hiram Gash, MD. Schedule an appointment as soon as possible for a visit in 1 week.   Specialty:  Orthopedic  Surgery Contact information: [address] [address] [phone]        [facility] Follow up in 6 week(s).   Specialty:  Neurology Contact information: [address] [place] [phone]         No Known Allergies  Consultations:  Orthopedics  Neurology  Procedures/Studies: Dg Elbow 2 Views Left  Result Date: 02/23/2018 CLINICAL DATA:  Fall EXAM: LEFT ELBOW - 2 VIEW COMPARISON:  None. FINDINGS: There is no evidence of fracture, dislocation, or joint effusion. There is no evidence of arthropathy or other focal bone abnormality. Soft tissues are unremarkable. IMPRESSION: Negative. Electronically Signed   By: Franchot Gallo M.D.   On: 02/23/2018 17:27   Dg Wrist 2 Views Left  Result Date: 02/23/2018 CLINICAL DATA:  Fall EXAM: LEFT WRIST - 2 VIEW COMPARISON:  None. FINDINGS: Image quality degraded by plaster splint. Comminuted fracture distal  radius with radiocarpal dislocation. Suboptimal anatomical evaluation, consider CT for further evaluation. Questionable fracture distal ulna. IMPRESSION: Fracture dislocation of the wrist. Recommend CT for better anatomical evaluation. Electronically Signed   By: Franchot Gallo M.D.   On: 02/23/2018 17:26   Dg Wrist Complete Left  Result Date: 02/23/2018 CLINICAL DATA:  Distal radius fracture, external fixation EXAM: LEFT WRIST - COMPLETE 3+ VIEW; DG C-ARM 61-120 MIN COMPARISON:  None. FINDINGS: Spot fluoroscopic views during external fixation of left wrist complex comminuted intra-articular fracture. There is improved alignment at the wrist joint with residual displacement of the distal radius fragments. Bones are osteopenic. IMPRESSION: Status post external fixation of the left wrist fracture dislocation with improved alignment. Electronically Signed   By: Jerilynn Mages.  Shick M.D.   On: 02/23/2018 21:36   Ct Head Wo Contrast  Result Date: 02/24/2018 CLINICAL DATA:   New onset seizure.  History of hypertension. EXAM: CT HEAD WITHOUT CONTRAST TECHNIQUE: Contiguous axial images were obtained from the base of the skull through the vertex without intravenous contrast. COMPARISON:  CT HEAD February 23, 2018 FINDINGS: BRAIN: No intraparenchymal hemorrhage, mass effect nor midline shift. The ventricles and sulci are normal for age. Confluent supratentorial white matter hypodensities. Patchy bilateral basal ganglia hypodensities. No acute large vascular territory infarcts. No abnormal extra-axial fluid collections. Basal cisterns are patent. VASCULAR: Moderate calcific atherosclerosis of the carotid siphons. SKULL: No skull fracture. Small LEFT frontal scalp hematoma, decreased from yesterday. SINUSES/ORBITS: Trace paranasal sinus mucosal thickening without air-fluid levels. Mastoid air cells are well aerated.The included ocular globes and orbital contents are non-suspicious. Status post bilateral ocular lens implants. OTHER: Patient is edentulous. IMPRESSION: 1. No acute intracranial process. Small residual LEFT frontal scalp hematoma. No skull fracture. Stable examination including severe chronic small vessel ischemic disease. Electronically Signed   By: Elon Alas M.D.   On: 02/24/2018 22:51   Ct Head Wo Contrast  Result Date: 02/23/2018 CLINICAL DATA:  Fall, head injury, left frontal scalp hematoma EXAM: CT HEAD WITHOUT CONTRAST TECHNIQUE: Contiguous axial images were obtained from the base of the skull through the vertex without intravenous contrast. COMPARISON:  02/08/2017 FINDINGS: Brain: Age related brain atrophy and extensive chronic white matter microvascular ischemic changes throughout both cerebral hemispheres. No acute intracranial hemorrhage, mass lesion, new infarction, midline shift, herniation, hydrocephalus, or extra-axial fluid collection. Cisterns are patent. Cerebellar atrophy as well. Vascular: Intracranial atherosclerosis noted.  No hyperdense vessel.  Skull: Left frontal scalp acute soft tissue hematoma. No underlying skull fracture. Sinuses/Orbits: Sinuses and mastoids are clear.  Symmetric orbits. Other: None. IMPRESSION: Atrophy and extensive chronic white matter microvascular ischemic changes. Acute left anterior frontal scalp hematoma without intracranial hemorrhage or skull fracture. Electronically Signed   By: Jerilynn Mages.  Shick M.D.   On: 02/23/2018 17:00   Pelvis Portable  Result Date: 02/23/2018 CLINICAL DATA:  Status post internal fixation of left hip fracture. EXAM: PORTABLE PELVIS 1-2 VIEWS COMPARISON:  Pelvic radiograph performed earlier today at 1:51 p.m. FINDINGS: No new fractures are seen. Pins are noted transfixing the patient's left femoral fracture in near anatomic alignment. Both femoral heads are seated normally within their respective acetabula. The sacroiliac joints are unremarkable in appearance. The visualized bowel gas pattern is grossly unremarkable in appearance. Scattered phleboliths are noted within the pelvis. IMPRESSION: Status post internal fixation of left femoral fracture in near anatomic alignment. Electronically Signed   By: Garald Balding M.D.   On: 02/23/2018 23:57   Dg Pelvis Portable  Result Date: 02/23/2018 CLINICAL DATA:  Fall today EXAM: PORTABLE PELVIS 1-2 VIEWS COMPARISON:  02/08/2017 FINDINGS: There is no evidence of pelvic fracture or diastasis. No pelvic bone lesions are seen. Atherosclerotic calcification. IMPRESSION: Negative. Electronically Signed   By: Franchot Gallo M.D.   On: 02/23/2018 14:23   Dg Chest Port 1 View  Result Date: 02/23/2018 CLINICAL DATA:  Fall today EXAM: PORTABLE CHEST 1 VIEW COMPARISON:  06/11/2017 FINDINGS: Heart size within normal limits. Negative for heart failure. Atherosclerotic aortic arch. Lungs are clear. Apical scarring left greater than right IMPRESSION: No active disease. Electronically Signed   By: Franchot Gallo M.D.   On: 02/23/2018 14:22   Dg Shoulder Left  Port  Result Date: 02/23/2018 CLINICAL DATA:  Fall EXAM: LEFT SHOULDER - 1 VIEW COMPARISON:  None. FINDINGS: There is no evidence of fracture or dislocation. There is no evidence of arthropathy or other focal bone abnormality. Soft tissues are unremarkable. IMPRESSION: Negative. Electronically Signed   By: Franchot Gallo M.D.   On: 02/23/2018 17:24   Dg C-arm 1-60 Min  Result Date: 02/23/2018 CLINICAL DATA:  Internal fixation hip fracture EXAM: OPERATIVE LEFT HIP (WITH PELVIS IF PERFORMED) multiple VIEWS TECHNIQUE: Fluoroscopic spot image(s) were submitted for interpretation post-operatively. COMPARISON:  None. FINDINGS: Multiple spot radiographs demonstrate 3 cannulated screws spanning the LEFT femoral neck. No fracture or dislocation. IMPRESSION: No complication following internal fixation LEFT femoral neck fracture. Electronically Signed   By: Suzy Bouchard M.D.   On: 02/23/2018 22:56   Dg C-arm 1-60 Min  Result Date: 02/23/2018 CLINICAL DATA:  Distal radius fracture, external fixation EXAM: LEFT WRIST - COMPLETE 3+ VIEW; DG C-ARM 61-120 MIN COMPARISON:  None. FINDINGS: Spot fluoroscopic views during external fixation of left wrist complex comminuted intra-articular fracture. There is improved alignment at the wrist joint with residual displacement of the distal radius fragments. Bones are osteopenic. IMPRESSION: Status post external fixation of the left wrist fracture dislocation with improved alignment. Electronically Signed   By: Jerilynn Mages.  Shick M.D.   On: 02/23/2018 21:36   Dg Hip Operative Unilat With Pelvis Left  Result Date: 02/23/2018 CLINICAL DATA:  Internal fixation hip fracture EXAM: OPERATIVE LEFT HIP (WITH PELVIS IF PERFORMED) multiple VIEWS TECHNIQUE: Fluoroscopic spot image(s) were submitted for interpretation post-operatively. COMPARISON:  None. FINDINGS: Multiple spot radiographs demonstrate 3 cannulated screws spanning the LEFT femoral neck. No fracture or dislocation. IMPRESSION: No  complication following internal fixation LEFT femoral neck fracture. Electronically Signed   By: Suzy Bouchard M.D.   On: 02/23/2018 22:56   Dg Hip Unilat W Or Wo Pelvis 2-3 Views Left  Result Date: 02/23/2018 CLINICAL DATA:  Left hip pain and stiffness EXAM: DG HIP (WITH OR WITHOUT PELVIS) 2-3V LEFT COMPARISON:  02/08/2017 FINDINGS: There is cortical irregularity of the proximal left hip surgical neck with mild foreshortening suspicious for a impacted subcapital femoral neck fracture. Bones are osteopenic. Visualized left hemipelvis intact. Peripheral atherosclerosis noted. IMPRESSION: Findings suspicious for a subtle proximal left hip subcapital femoral neck impacted fracture. Osteopenia Atherosclerosis Electronically Signed   By: Jerilynn Mages.  Shick M.D.   On: 02/23/2018 17:44    PROCEDURE 02/23/2018:SURGEON:Joshua Llana Aliment, MD  1. Left distal radius and ulna open irrigation, debridement, skin, subcutaneous tissue, and bone 2. Application of external fixator to the left distal radius going from metacarpal to radial shaft 3. Complex wound closure, 15 cm open wound on the ulnar side of the distal forearm 4. Repair of degloving skin injury to the left shoulder, 5 x 5 cm 5. Closed reduction  with percutaneous pin fixation left femoral neck  Subjective: No complaints, getting breathing treatment this morning. No chest pain or dyspnea or further seizures. Eating well.   Discharge Exam: Vitals:   02/28/18 0451 02/28/18 0906  BP: 135/67   Pulse: 82 79  Resp:  18  Temp: 98.8 F (37.1 C)   SpO2: 93% 94%   General: Elderly pleasantly mildly confused female in no distress  Cardiovascular: RRR, S1/S2 +, no rubs, no gallops Respiratory: CTA bilaterally, no wheezing, no rhonchi Abdominal: Soft, NT, ND, bowel sounds + Ext: Left arm externally fixated with cast and pins. Fingers with +AROM, SILT. RUE no deformity. LLE with dressing on hip c/d/i, soft compartment, toes with AROM, SILT, DP pulses  2+, symmetric.  Skin: Extensive ecchymoses on upper extremities without focal fluctuance/hematoma, seeming to be in usual evolution without advancement. Left scalp hematoma without crepitus, also stable. Developing left periocular ecchymosis. Left shoulder dressing c/d/i, not taken down, no extension of ecchymosis, dressing c/d/i.  Neuro: Alert and oriented but very forgetful and perseverates on certain facts. No focal neurological deficits.  Labs: Basic Metabolic Panel: Recent Labs  Lab 02/23/18 1416 02/23/18 1434 02/24/18 0646 02/25/18 0641  NA 140 141 138 137  K 2.9* 2.9* 3.9 3.6  CL 109 106 108 108  CO2 20*  --  21* 21*  GLUCOSE 144* 143* 151* 128*  BUN 11 13 16 12   CREATININE 0.70 0.70 0.97 0.71  CALCIUM 8.3*  --  7.9* 7.7*   Liver Function Tests: Recent Labs  Lab 02/23/18 1416 02/24/18 0646  AST 21 26  ALT 16 13*  ALKPHOS 88 67  BILITOT 1.0 0.8  PROT 6.1* 4.9*  ALBUMIN 3.7 2.9*   CBC: Recent Labs  Lab 02/23/18 1416 02/23/18 1434 02/24/18 1022 02/25/18 0948 02/26/18 0737  WBC 11.5*  --  8.5 5.3 7.5  HGB 14.2 13.9 10.6* 8.1* 10.5*  HCT 41.4 41.0 31.3* 23.6* 31.2*  MCV 91.8  --  92.6 93.7 94.0  PLT 166  --  160 85* 143*   Urinalysis    Component Value Date/Time   COLORURINE YELLOW 02/23/2018 1510   APPEARANCEUR HAZY (A) 02/23/2018 1510   LABSPEC 1.009 02/23/2018 1510   PHURINE 6.0 02/23/2018 1510   GLUCOSEU NEGATIVE 02/23/2018 1510   HGBUR MODERATE (A) 02/23/2018 1510   BILIRUBINUR NEGATIVE 02/23/2018 1510   KETONESUR 5 (A) 02/23/2018 1510   PROTEINUR NEGATIVE 02/23/2018 1510   NITRITE NEGATIVE 02/23/2018 1510   LEUKOCYTESUR MODERATE (A) 02/23/2018 1510    Time coordinating discharge: Approximately 40 minutes  Vance Gather, MD  [facility] 02/28/2018, 10:07 AM Pager [phone]

## 2018-02-28 NOTE — Clinical Social Work Placement (Signed)
   CLINICAL SOCIAL WORK PLACEMENT  NOTE  Date:  02/28/2018  Patient Details  Name: Rebecca Petty MRN: [id number] Date of Birth: [birth year]  Clinical Social Work is seeking post-discharge placement for this patient at the [place] level of care (*CSW will initial, date and re-position this form in  chart as items are completed):  Yes   Patient/family provided with [place] Work Department's list of facilities offering this level of care within the geographic area requested by the patient (or if unable, by the patient's family).  Yes   Patient/family informed of their freedom to choose among providers that offer the needed level of care, that participate in Medicare, Medicaid or managed care program needed by the patient, have an available bed and are willing to accept the patient.  Yes   Patient/family informed of [facility]'s ownership interest in [facility] and [facility], as well as of the fact that they are under no obligation to receive care at these facilities.  PASRR submitted to EDS on       PASRR number received on 02/25/18     Existing PASRR number confirmed on       FL2 transmitted to all facilities in geographic area requested by pt/family on 02/25/18     FL2 transmitted to all facilities within larger geographic area on       Patient informed that his/her managed care company has contracts with or will negotiate with certain facilities, including the following:        Yes   Patient/family informed of bed offers received.  Patient chooses bed at [place]     Physician recommends and patient chooses bed at      Patient to be transferred to [place] on 02/28/18.  Patient to be transferred to facility by [facility]     Patient family notified on 02/28/18 of transfer.  Name of family member notified:  daughter dorothy contacted     PHYSICIAN       Additional Comment:     _______________________________________________ Normajean Baxter, LCSW 02/28/2018, 12:29 PM

## 2018-02-28 NOTE — Progress Notes (Signed)
Report given to nurse Ofilia Neas.

## 2018-02-28 NOTE — Progress Notes (Signed)
Physical Therapy Treatment Patient Details Name: Rebecca Petty MRN: [id number] DOB: 12-20-1925 Today's Date: 02/28/2018    History of Present Illness Pt is an 84 y/o female who presents s/p fall, sustaining an open grade 3 left distal radius and ulna fracture, L femoral neck shaft fracture, and shoulder skin tear measuring 5x5 cm. Pt is now s/p external fixator to the L distal radius, repair of degloving skin injury to the L shoulder, and closed reduction with percutaneous pin fixation of the L femoral neck. Pt is currently NWB to the L wrist, WBAT through the L elbow, and WBAT through the LLE. PMH significant for thyroid disease, HTN. 4/18 Pt had a grand mal seizure and now has seizure precautions.     PT Comments    Pt progressing towards physical therapy goals. Appeared to have increased pain and stiffness this morning compared to previous PT session. Pt required +2 max assist for all aspects of mobility. Was able to tolerate initiation of ther-ex with minimal complaints of pain. Will continue to follow and progress as able per POC.   Follow Up Recommendations  SNF;Supervision/Assistance - 24 hour     Equipment Recommendations  Other (comment)(TBD by next venue of care)    Recommendations for Other Services       Precautions / Restrictions Precautions Precautions: Fall Precaution Comments: Ex-fix on L wrist; very thin/fragile skin Restrictions Weight Bearing Restrictions: Yes LUE Weight Bearing: Weight bear through elbow only LLE Weight Bearing: Weight bearing as tolerated    Mobility  Bed Mobility Overal bed mobility: Needs Assistance Bed Mobility: Supine to Sit     Supine to sit: Max assist;+2 for physical assistance;HOB elevated     General bed mobility comments: Bed pad used to assist pt in scooting around to EOB. Pt required assist at LLE for advancement towards R edge of bed, as well as for posterior support at the trunk to elevate into full sitting position.    Transfers Overall transfer level: Needs assistance Equipment used: 2 person hand held assist Transfers: Sit to/from [facility] Sit to Stand: Max assist;+2 safety/equipment Stand pivot transfers: Max assist;+2 safety/equipment       General transfer comment: Pt required assist to reposition LE's into flexion at edge of bed prior to sit>stand. Max +2 for sit>stand, and then to take pivotal steps around to the chair. Pt essentially was able to take steps with the RLE but was not able to advance the LLE at all.   Ambulation/Gait             General Gait Details: Unable   Stairs             Wheelchair Mobility    Modified Rankin (Stroke Patients Only)       Balance Overall balance assessment: Needs assistance Sitting-balance support: Feet supported;Single extremity supported Sitting balance-Leahy Scale: Poor Sitting balance - Comments: Min assist to min guard assist  Postural control: Right lateral lean   Standing balance-Leahy Scale: Zero Standing balance comment: Max assist for standing balance                             Cognition Arousal/Alertness: Awake/alert Behavior During Therapy: WFL for tasks assessed/performed Overall Cognitive Status: Impaired/Different from baseline Area of Impairment: Attention;Memory;Safety/judgement                 Orientation Level: Disoriented to;Place;Time;Situation Current Attention Level: Alternating Memory: Decreased recall of precautions;Decreased short-term memory  Safety/Judgement: Decreased awareness of safety;Decreased awareness of deficits Awareness: Emergent   General Comments: Pt repeating herself during session. Asking about the pain in her LLE and then stating "I know why it hurts" when therapist answers her questions.      Exercises General Exercises - Lower Extremity Long Arc Quad: 10 reps;AROM    General Comments        Pertinent Vitals/Pain Pain Assessment:  Faces Faces Pain Scale: Hurts even more Pain Location: L wrist and L hip Pain Descriptors / Indicators: Operative site guarding;Aching;Sore Pain Intervention(s): Limited activity within patient's tolerance;Monitored during session;Repositioned    Home Living                      Prior Function            PT Goals (current goals can now be found in the care plan section) Acute Rehab PT Goals Patient Stated Goal: less pain; regain independence  PT Goal Formulation: With patient Time For Goal Achievement: 03/10/18 Potential to Achieve Goals: Good Progress towards PT goals: Progressing toward goals    Frequency    Min 3X/week      PT Plan Current plan remains appropriate    Co-evaluation              AM-PAC PT "6 Clicks" Daily Activity  Outcome Measure  Difficulty turning over in bed (including adjusting bedclothes, sheets and blankets)?: Unable Difficulty moving from lying on back to sitting on the side of the bed? : Unable Difficulty sitting down on and standing up from a chair with arms (e.g., wheelchair, bedside commode, etc,.)?: A Lot Help needed moving to and from a bed to chair (including a wheelchair)?: A Lot Help needed walking in hospital room?: Total Help needed climbing 3-5 steps with a railing? : Total 6 Click Score: 8    End of Session Equipment Utilized During Treatment: Gait belt Activity Tolerance: Patient tolerated treatment well Patient left: in chair;with call bell/phone within reach;with chair alarm set Nurse Communication: Mobility status PT Visit Diagnosis: Unsteadiness on feet (R26.81);History of falling (Z91.81);Pain Pain - Right/Left: Left Pain - part of body: Shoulder;Hip;Hand     Time: 0750-0820 PT Time Calculation (min) (ACUTE ONLY): 30 min  Charges:  $Gait Training: 23-37 mins                    G Codes:       Rebecca Petty, PT, DPT Acute Rehabilitation Services Pager: [phone]    Thelma Comp 02/28/2018,  8:29 AM

## 2018-04-18 ENCOUNTER — Encounter (HOSPITAL_COMMUNITY): Payer: Self-pay | Admitting: *Deleted

## 2018-04-18 ENCOUNTER — Other Ambulatory Visit: Payer: Self-pay

## 2018-04-18 NOTE — Progress Notes (Signed)
Anesthesia Chart Review:  Pt is a same day work up   Case:  [id number] Date/Time:  04/19/18 1015   Procedure:  REMOVAL EXTERNAL FIXATION ARM (Left )   Anesthesia type:  Choice   Pre-op diagnosis:  LEFT DISTAL RADIUS FRACTURE   Location:  [facility] OR ROOM 04 / [place] [facility]   Surgeon:  Marchia Bond, MD      DISCUSSION: - Pt is an 84 year old female with hx HTN.   - S/p external fixation L arm and L hip pinning 02/23/18. Discharged to SNF   PROVIDERS: - Currently residing in SNF   LABS: Will be obtained day of surgery   IMAGES:  1 view CXR 02/23/18: No active disease   EKG 02/23/18: Sinus rhythm. Prolonged PR interval. RBBB and LAFB   Past Medical History:  Diagnosis Date  . Fracture of distal radius and ulna, left, open type III, initial encounter 02/23/2018  . Fracture of femoral neck, left ([employer]) 02/23/2018  . GERD (gastroesophageal reflux disease)   . Hypertension   . Thyroid disease     Past Surgical History:  Procedure Laterality Date  . EXTERNAL FIXATION ARM Left 02/23/2018   Procedure: EXTERNAL FIXATION Left ARM, irrigation and debridement bone, complex wound closure;  Surgeon: Marchia Bond, MD;  Location: [facility];  Service: Orthopedics;  Laterality: Left;  . HIP PINNING,CANNULATED Left 02/23/2018   Procedure: CANNULATED LEFT HIP PINNING;  Surgeon: Marchia Bond, MD;  Location: [place];  Service: Orthopedics;  Laterality: Left;    MEDICATIONS: No current facility-administered medications for this encounter.    Marland Kitchen acetaminophen (TYLENOL) 325 MG tablet  . amLODipine (NORVASC) 5 MG tablet  . Cholecalciferol (VITAMIN D3) 2000 units TABS  . fluticasone (FLOVENT HFA) 220 MCG/ACT inhaler  . HYDROcodone-acetaminophen (NORCO/VICODIN) 5-325 MG tablet  . levothyroxine (SYNTHROID, LEVOTHROID) 88 MCG tablet  . omeprazole (PRILOSEC) 40 MG capsule  . tiotropium (SPIRIVA HANDIHALER) 18 MCG inhalation capsule  . traMADol (ULTRAM) 50 MG tablet  . enoxaparin (LOVENOX) 30 MG/0.3ML injection  .  sennosides-docusate sodium (SENOKOT-S) 8.6-50 MG tablet    If labs acceptable day of surgery, I anticipate pt can proceed with surgery as scheduled.    Willeen Cass, FNP-BC [facility] Short Stay Surgical Center/Anesthesiology Phone: [phone] 04/18/2018 11:30 AM

## 2018-04-18 NOTE — Pre-Procedure Instructions (Addendum)
    Rebecca Petty  04/18/2018             Your procedure is scheduled on Tuesday, June 11.  Report to [facility] Admitting at 8:00 AM                Your surgery or procedure is scheduled for 10:30 AM    Call this number if you have problems the morning of surgery:  [phone]- pre- op desk   ############ Day of  Surgery - Send the Medication Record with the Medications given Documented  ###########   Remember:  No food or drink  after midnight.         Take these medicines the morning of surgery with A SIP OF WATER : amLODipine (NORVASC)  levothyroxine (SYNTHROID, LEVOTHROID)   Omeprazole (Prilosec) Use : Flovent  And Sprivia inhalers  If needed may take: Tylenol Or Tramadol Or Hydrocodone- acetaminophen   Do not wear jewelry, make-up or nail polish.  Do not wear lotions, powders, or perfumes, or deodorant.  Do not shave 48 hours prior to surgery.  Men may shave face and neck.  Do not bring valuables to the hospital.  [facility] is not responsible for any belongings or valuables.  Contacts, dentures or bridgework may not be worn into surgery.  Leave your suitcase in the car.  After surgery it may be brought to your room.  Per orders Dr. Roberts Gaudy

## 2018-04-19 ENCOUNTER — Encounter (HOSPITAL_COMMUNITY): Payer: Self-pay | Admitting: *Deleted

## 2018-04-19 ENCOUNTER — Ambulatory Visit (HOSPITAL_COMMUNITY): Payer: Medicare Other | Admitting: Emergency Medicine

## 2018-04-19 ENCOUNTER — Encounter (HOSPITAL_COMMUNITY)
Admission: RE | Disposition: A | Discharge status: Home / Self Care | Payer: Self-pay | Source: Ambulatory Visit | Attending: Orthopedic Surgery

## 2018-04-19 ENCOUNTER — Ambulatory Visit (HOSPITAL_COMMUNITY)
Admission: RE | Admit: 2018-04-19 | Discharge: 2018-04-19 | Disposition: A | Discharge status: Home / Self Care | Payer: Medicare Other | Source: Ambulatory Visit | Attending: Orthopedic Surgery | Admitting: Orthopedic Surgery

## 2018-04-19 DIAGNOSIS — F419 Anxiety disorder, unspecified: Secondary | ICD-10-CM | POA: Diagnosis not present

## 2018-04-19 DIAGNOSIS — Y798 Miscellaneous orthopedic devices associated with adverse incidents, not elsewhere classified: Secondary | ICD-10-CM | POA: Diagnosis not present

## 2018-04-19 DIAGNOSIS — S52502F Unspecified fracture of the lower end of left radius, subsequent encounter for open fracture type IIIA, IIIB, or IIIC with routine healing: Secondary | ICD-10-CM

## 2018-04-19 DIAGNOSIS — I10 Essential (primary) hypertension: Secondary | ICD-10-CM | POA: Insufficient documentation

## 2018-04-19 DIAGNOSIS — T8484XA Pain due to internal orthopedic prosthetic devices, implants and grafts, initial encounter: Secondary | ICD-10-CM | POA: Insufficient documentation

## 2018-04-19 DIAGNOSIS — Z87891 Personal history of nicotine dependence: Secondary | ICD-10-CM | POA: Diagnosis not present

## 2018-04-19 DIAGNOSIS — Y929 Unspecified place or not applicable: Secondary | ICD-10-CM | POA: Insufficient documentation

## 2018-04-19 DIAGNOSIS — S52602F Unspecified fracture of lower end of left ulna, subsequent encounter for open fracture type IIIA, IIIB, or IIIC with routine healing: Secondary | ICD-10-CM

## 2018-04-19 DIAGNOSIS — E039 Hypothyroidism, unspecified: Secondary | ICD-10-CM | POA: Diagnosis not present

## 2018-04-19 DIAGNOSIS — Z79899 Other long term (current) drug therapy: Secondary | ICD-10-CM | POA: Insufficient documentation

## 2018-04-19 DIAGNOSIS — K219 Gastro-esophageal reflux disease without esophagitis: Secondary | ICD-10-CM | POA: Diagnosis not present

## 2018-04-19 HISTORY — DX: Hypothyroidism, unspecified: E03.9

## 2018-04-19 HISTORY — DX: Unspecified convulsions: R56.9

## 2018-04-19 HISTORY — PX: EXTERNAL FIXATION REMOVAL: SHX5040

## 2018-04-19 HISTORY — DX: Unspecified osteoarthritis, unspecified site: M19.90

## 2018-04-19 HISTORY — DX: Anxiety disorder, unspecified: F41.9

## 2018-04-19 HISTORY — DX: Malignant (primary) neoplasm, unspecified: C80.1

## 2018-04-19 HISTORY — DX: Other amnesia: R41.3

## 2018-04-19 LAB — BASIC METABOLIC PANEL
Anion gap: 10 (ref 5–15)
BUN: 10 mg/dL (ref 6–20)
CO2: 29 mmol/L (ref 22–32)
CREATININE: 0.54 mg/dL (ref 0.44–1.00)
Calcium: 9.1 mg/dL (ref 8.9–10.3)
Chloride: 101 mmol/L (ref 101–111)
GFR calc non Af Amer: >60 mL/min (ref >60)
Glucose, Bld: 120 mg/dL — ABNORMAL HIGH (ref 65–99)
Potassium: 2.9 mmol/L — ABNORMAL LOW (ref 3.5–5.1)
Sodium: 140 mmol/L (ref 135–145)

## 2018-04-19 LAB — CBC
HCT: 38.4 % (ref 36.0–46.0)
Hemoglobin: 12.7 g/dL (ref 12.0–15.0)
MCH: 29.5 pg (ref 26.0–34.0)
MCHC: 33.1 g/dL (ref 30.0–36.0)
MCV: 89.3 fL (ref 78.0–100.0)
PLATELETS: 295 10*3/uL (ref 150–400)
RBC: 4.3 MIL/uL (ref 3.87–5.11)
RDW: 12.3 % (ref 11.5–15.5)
WBC: 8.6 10*3/uL (ref 4.0–10.5)

## 2018-04-19 SURGERY — REMOVAL, EXTERNAL FIXATION DEVICE, UPPER EXTREMITY
Anesthesia: General | Laterality: Left

## 2018-04-19 MED ORDER — CEFAZOLIN SODIUM-DEXTROSE 2-4 GM/100ML-% IV SOLN
2.0000 g | INTRAVENOUS | Status: AC
  Administered 2018-04-19: 2 g via INTRAVENOUS

## 2018-04-19 MED ORDER — FENTANYL CITRATE (PF) 250 MCG/5ML IJ SOLN
INTRAMUSCULAR | Status: DC | PRN
  Administered 2018-04-19: 50 ug via INTRAVENOUS

## 2018-04-19 MED ORDER — ONDANSETRON HCL 4 MG/2ML IJ SOLN: 4.0000 mg | Freq: Once | INTRAMUSCULAR | Status: DC | PRN

## 2018-04-19 MED ORDER — FENTANYL CITRATE (PF) 100 MCG/2ML IJ SOLN: 25.0000 ug | INTRAMUSCULAR | Status: DC | PRN

## 2018-04-19 MED ORDER — PROPOFOL 10 MG/ML IV BOLUS
INTRAVENOUS | Status: DC | PRN
  Administered 2018-04-19: 100 mg via INTRAVENOUS

## 2018-04-19 MED ORDER — LACTATED RINGERS IV SOLN
INTRAVENOUS | Status: DC
  Administered 2018-04-19: 10:00:00 via INTRAVENOUS

## 2018-04-19 MED ORDER — LIDOCAINE HCL (CARDIAC) PF 100 MG/5ML IV SOSY
PREFILLED_SYRINGE | INTRAVENOUS | Status: DC | PRN
  Administered 2018-04-19: 40 mg via INTRAVENOUS

## 2018-04-19 MED ORDER — 0.9 % SODIUM CHLORIDE (POUR BTL) OPTIME
TOPICAL | Status: DC | PRN
  Administered 2018-04-19: 1000 mL

## 2018-04-19 MED ORDER — ONDANSETRON HCL 4 MG/2ML IJ SOLN
INTRAMUSCULAR | Status: DC | PRN
  Administered 2018-04-19: 4 mg via INTRAVENOUS

## 2018-04-19 MED ORDER — FENTANYL CITRATE (PF) 250 MCG/5ML IJ SOLN
INTRAMUSCULAR | Status: AC
  Filled 2018-04-19: qty 5

## 2018-04-19 MED ORDER — CEFAZOLIN SODIUM-DEXTROSE 2-4 GM/100ML-% IV SOLN
INTRAVENOUS | Status: AC
  Filled 2018-04-19: qty 100

## 2018-04-19 MED ORDER — HYDROMORPHONE HCL 1 MG/ML IJ SOLN
INTRAMUSCULAR | Status: AC
  Filled 2018-04-19: qty 0.5

## 2018-04-19 SURGICAL SUPPLY — 56 items
BANDAGE ACE 4X5 VEL STRL LF (GAUZE/BANDAGES/DRESSINGS) ×3 IMPLANT
BANDAGE ACE 6X5 VEL STRL LF (GAUZE/BANDAGES/DRESSINGS) ×3 IMPLANT
BANDAGE ESMARK 6X9 LF (GAUZE/BANDAGES/DRESSINGS) ×1 IMPLANT
BNDG COHESIVE 6X5 TAN STRL LF (GAUZE/BANDAGES/DRESSINGS) ×3 IMPLANT
BNDG ESMARK 6X9 LF (GAUZE/BANDAGES/DRESSINGS) ×3
BNDG GAUZE ELAST 4 BULKY (GAUZE/BANDAGES/DRESSINGS) ×6 IMPLANT
CLEANER TIP ELECTROSURG 2X2 (MISCELLANEOUS) ×3 IMPLANT
COVER SURGICAL LIGHT HANDLE (MISCELLANEOUS) ×3 IMPLANT
CUFF TOURNIQUET SINGLE 18IN (TOURNIQUET CUFF) IMPLANT
CUFF TOURNIQUET SINGLE 24IN (TOURNIQUET CUFF) IMPLANT
CUFF TOURNIQUET SINGLE 34IN LL (TOURNIQUET CUFF) IMPLANT
DRAPE C-ARM 42X72 X-RAY (DRAPES) IMPLANT
DRAPE U-SHAPE 47X51 STRL (DRAPES) ×3 IMPLANT
DRSG ADAPTIC 3X8 NADH LF (GAUZE/BANDAGES/DRESSINGS) ×3 IMPLANT
DRSG TEGADERM 4X4.75 (GAUZE/BANDAGES/DRESSINGS) ×3 IMPLANT
ELECT REM PT RETURN 9FT ADLT (ELECTROSURGICAL) ×3
ELECTRODE REM PT RTRN 9FT ADLT (ELECTROSURGICAL) ×1 IMPLANT
EVACUATOR 1/8 PVC DRAIN (DRAIN) IMPLANT
GAUZE SPONGE 4X4 12PLY STRL (GAUZE/BANDAGES/DRESSINGS) ×3 IMPLANT
GLOVE BIOGEL PI ORTHO PRO SZ8 (GLOVE) ×4
GLOVE ORTHO TXT STRL SZ7.5 (GLOVE) ×3 IMPLANT
GLOVE PI ORTHO PRO STRL SZ8 (GLOVE) ×2 IMPLANT
GLOVE SURG ORTHO 8.0 STRL STRW (GLOVE) ×6 IMPLANT
GOWN STRL REUS W/ TWL XL LVL3 (GOWN DISPOSABLE) ×1 IMPLANT
GOWN STRL REUS W/TWL 2XL LVL3 (GOWN DISPOSABLE) IMPLANT
GOWN STRL REUS W/TWL XL LVL3 (GOWN DISPOSABLE) ×2
HANDPIECE INTERPULSE COAX TIP (DISPOSABLE)
KIT BASIN OR (CUSTOM PROCEDURE TRAY) ×3 IMPLANT
KIT TURNOVER KIT B (KITS) ×3 IMPLANT
MANIFOLD NEPTUNE II (INSTRUMENTS) ×3 IMPLANT
NEEDLE 22X1 1/2 (OR ONLY) (NEEDLE) IMPLANT
NS IRRIG 1000ML POUR BTL (IV SOLUTION) ×3 IMPLANT
PACK ORTHO EXTREMITY (CUSTOM PROCEDURE TRAY) ×3 IMPLANT
PAD ARMBOARD 7.5X6 YLW CONV (MISCELLANEOUS) ×6 IMPLANT
PADDING CAST COTTON 6X4 STRL (CAST SUPPLIES) ×9 IMPLANT
SET HNDPC FAN SPRY TIP SCT (DISPOSABLE) IMPLANT
SPLINT PLASTER EXTRA FAST 3X15 (CAST SUPPLIES) ×2
SPLINT PLASTER GYPS XFAST 3X15 (CAST SUPPLIES) ×1 IMPLANT
SPONGE LAP 18X18 X RAY DECT (DISPOSABLE) ×3 IMPLANT
STAPLER VISISTAT 35W (STAPLE) IMPLANT
STOCKINETTE IMPERVIOUS LG (DRAPES) ×3 IMPLANT
SUCTION FRAZIER HANDLE 10FR (MISCELLANEOUS)
SUCTION TUBE FRAZIER 10FR DISP (MISCELLANEOUS) IMPLANT
SUT ETHILON 3 0 PS 1 (SUTURE) IMPLANT
SUT VIC AB 0 CT1 27 (SUTURE) ×4
SUT VIC AB 0 CT1 27XBRD ANBCTR (SUTURE) ×2 IMPLANT
SUT VIC AB 2-0 CT1 27 (SUTURE) ×4
SUT VIC AB 2-0 CT1 TAPERPNT 27 (SUTURE) ×2 IMPLANT
SYR CONTROL 10ML LL (SYRINGE) IMPLANT
TOWEL OR 17X24 6PK STRL BLUE (TOWEL DISPOSABLE) ×6 IMPLANT
TOWEL OR 17X26 10 PK STRL BLUE (TOWEL DISPOSABLE) ×6 IMPLANT
TUBE CONNECTING 12'X1/4 (SUCTIONS) ×1
TUBE CONNECTING 12X1/4 (SUCTIONS) ×2 IMPLANT
UNDERPAD 30X30 (UNDERPADS AND DIAPERS) ×3 IMPLANT
WATER STERILE IRR 1000ML POUR (IV SOLUTION) ×6 IMPLANT
YANKAUER SUCT BULB TIP NO VENT (SUCTIONS) ×3 IMPLANT

## 2018-04-19 NOTE — Op Note (Signed)
04/19/2018  12:04 PM  PATIENT:  Rebecca Petty    PRE-OPERATIVE DIAGNOSIS: Open grade 3 left distal radius and ulna fracture, status post I&D with external fixation  POST-OPERATIVE DIAGNOSIS:  Same  PROCEDURE:  REMOVAL EXTERNAL FIXATION ARM  3 views left wrist  SURGEON:  Johnny Bridge, MD  PHYSICIAN ASSISTANT: Joya Gaskins, OPA-C  ANESTHESIA:   General  PREOPERATIVE INDICATIONS:  [name] is a  84 y.o. female who had an open distal radius and ulna fracture about 8 weeks ago, and underwent surgical debridement with external fixation.  She has elected to come back for surgical removal of the external fixator.  The risks benefits and alternatives were discussed with the patient preoperatively including but not limited to the risks of infection, bleeding, nerve injury, cardiopulmonary complications, the need for revision surgery, among others, and the patient was willing to proceed.  ESTIMATED BLOOD LOSS: None  OPERATIVE IMPLANTS: External fixator removed from left upper extremity  OPERATIVE FINDINGS: The distal radius fracture actually had a fair amount of stability during live examination under fluoroscopy.  The pin sites were clean.  All of her wounds had healed.  OPERATIVE PROCEDURE: The patient is brought to the operating room and placed in supine position.  General anesthesia was administered.  The left upper extremity was cleaned with chlorhexidine at the pin sites.  The pins were removed.  This was without difficulty.  Pin sites were dressed with sterile gauze, with Tegaderm.  A mini C-arm was used to evaluate the fracture position and integrity.  3 views of the left wrist loss fluoroscopy live were taken, which demonstrated satisfactory overall alignment of the comminuted fracture, without gross mobility at the fracture site.  Volar splint was then applied, and the patient was awakened and returned to the PACU in stable and satisfactory condition.  There were no  complications and she tolerated the procedure well.

## 2018-04-19 NOTE — Transfer of Care (Signed)
Immediate Anesthesia Transfer of Care Note  Patient: Rebecca Petty  Procedure(s) Performed: REMOVAL EXTERNAL FIXATION ARM (Left )  Patient Location: PACU  Anesthesia Type:General  Level of Consciousness: awake, alert  and oriented  Airway & Oxygen Therapy: Patient Spontanous Breathing and Patient connected to nasal cannula oxygen  Post-op Assessment: Report given to RN and Post -op Vital signs reviewed and stable  Post vital signs: Reviewed and stable  Last Vitals:  Vitals Value Taken Time  BP 128/82 04/19/2018 12:01 PM  Temp    Pulse    Resp 18 04/19/2018 12:02 PM  SpO2    Vitals shown include unvalidated device data.  Last Pain:  Vitals:   04/19/18 1001  TempSrc:   PainSc: 0-No pain         Complications: No apparent anesthesia complications

## 2018-04-19 NOTE — Anesthesia Procedure Notes (Signed)
Procedure Name: LMA Insertion Date/Time: 04/19/2018 11:33 AM Performed by: Mariea Clonts, CRNA Pre-anesthesia Checklist: Patient identified, Emergency Drugs available, Suction available and Patient being monitored Patient Re-evaluated:Patient Re-evaluated prior to induction Oxygen Delivery Method: Circle System Utilized Preoxygenation: Pre-oxygenation with 100% oxygen Induction Type: IV induction Ventilation: Mask ventilation without difficulty LMA: LMA inserted LMA Size: 4.0 Number of attempts: 1 Airway Equipment and Method: Bite block Placement Confirmation: positive ETCO2 Tube secured with: Tape Dental Injury: Teeth and Oropharynx as per pre-operative assessment

## 2018-04-19 NOTE — Anesthesia Postprocedure Evaluation (Signed)
Anesthesia Post Note  Patient: Rebecca Petty  Procedure(s) Performed: REMOVAL EXTERNAL FIXATION ARM (Left )     Patient location during evaluation: PACU Anesthesia Type: General Level of consciousness: awake and alert Pain management: pain level controlled Vital Signs Assessment: post-procedure vital signs reviewed and stable Respiratory status: spontaneous breathing, nonlabored ventilation, respiratory function stable and patient connected to nasal cannula oxygen Cardiovascular status: blood pressure returned to baseline and stable Postop Assessment: no apparent nausea or vomiting Anesthetic complications: no    Last Vitals:  Vitals:   04/19/18 1215 04/19/18 1230  BP: (!) 145/72 (!) 164/78  Pulse: 77   Resp: 16 20  Temp:    SpO2: 99% 100%    Last Pain:  Vitals:   04/19/18 1230  TempSrc:   PainSc: 0-No pain                 Jarion Hawthorne COKER

## 2018-04-19 NOTE — Anesthesia Preprocedure Evaluation (Signed)
Anesthesia Evaluation  Patient identified by MRN, date of birth, ID band Patient awake    Reviewed: Allergy & Precautions, NPO status , Patient's Chart, lab work & pertinent test results  Airway Mallampati: II  TM Distance: >3 FB Neck ROM: Full    Dental  (+) Dental Advisory Given, Partial Upper   Pulmonary former smoker,    breath sounds clear to auscultation       Cardiovascular hypertension,  Rhythm:Regular Rate:Normal     Neuro/Psych    GI/Hepatic   Endo/Other    Renal/GU      Musculoskeletal   Abdominal   Peds  Hematology   Anesthesia Other Findings   Reproductive/Obstetrics                             Anesthesia Physical Anesthesia Plan  ASA: II  Anesthesia Plan: General   Post-op Pain Management:    Induction: Intravenous  PONV Risk Score and Plan: Ondansetron  Airway Management Planned: LMA  Additional Equipment:   Intra-op Plan:   Post-operative Plan:   Informed Consent: I have reviewed the patients History and Physical, chart, labs and discussed the procedure including the risks, benefits and alternatives for the proposed anesthesia with the patient or authorized representative who has indicated his/her understanding and acceptance.   Dental advisory given  Plan Discussed with: CRNA and Anesthesiologist  Anesthesia Plan Comments:         Anesthesia Quick Evaluation

## 2018-04-19 NOTE — H&P (Signed)
PREOPERATIVE H&P  Chief Complaint: Retained external fixator, left arm  HPI: Rebecca Petty is a 84 y.o. female who presents for preoperative history and physical with a diagnosis of comminuted open distal radius fracture, status post external fixation and closure with washout she is elected for removal of the external fixator.. Symptoms are rated as moderate to severe, and have been worsening.  This is significantly impairing activities of daily living.  She has elected for surgical management.   Past Medical History:  Diagnosis Date  . Anxiety    panic attacks  . Arthritis    knee  . Cancer ([place])    skni foot  . Fracture of distal radius and ulna, left, open type III, initial encounter 02/23/2018  . Fracture of femoral neck, left ([place]) 02/23/2018  . GERD (gastroesophageal reflux disease)   . Hypertension   . Hypothyroidism   . Seizures ([place]) 02/2018  . Short-term memory loss   . Thyroid disease    Past Surgical History:  Procedure Laterality Date  . COLONOSCOPY    . EXTERNAL FIXATION ARM Left 02/23/2018   Procedure: EXTERNAL FIXATION Left ARM, irrigation and debridement bone, complex wound closure;  Surgeon: Marchia Bond, MD;  Location: [place];  Service: Orthopedics;  Laterality: Left;  . EYE SURGERY Bilateral    catarcts  . HIP PINNING,CANNULATED Left 02/23/2018   Procedure: CANNULATED LEFT HIP PINNING;  Surgeon: Marchia Bond, MD;  Location: [place];  Service: Orthopedics;  Laterality: Left;   Social History   Socioeconomic History  . Marital status: Married    Spouse name: Not on file  . Number of children: Not on file  . Years of education: Not on file  . Highest education level: Not on file  Occupational History  . Not on file  Social Needs  . Financial resource strain: Not on file  . Food insecurity:    Worry: Not on file    Inability: Not on file  . Transportation needs:    Medical: Not on file    Non-medical: Not on file  Tobacco Use  . Smoking status:  Former Research scientist (life sciences)  . Smokeless tobacco: Never Used  Substance and Sexual Activity  . Alcohol use: Not Currently  . Drug use: Not Currently  . Sexual activity: Not on file  Lifestyle  . Physical activity:    Days per week: Not on file    Minutes per session: Not on file  . Stress: Not on file  Relationships  . Social connections:    Talks on phone: Not on file    Gets together: Not on file    Attends religious service: Not on file    Active member of club or organization: Not on file    Attends meetings of clubs or organizations: Not on file    Relationship status: Not on file  Other Topics Concern  . Not on file  Social History Narrative  . Not on file   History reviewed. No pertinent family history. No Known Allergies Prior to Admission medications   Medication Sig Start Date End Date Taking? Authorizing Provider  amLODipine (NORVASC) 5 MG tablet Take 5 mg by mouth daily. 02/23/18  Yes [provider]  Cholecalciferol (VITAMIN D3) 2000 units TABS Take 2,000 Units by mouth daily.   Yes [provider]  fluticasone (FLOVENT HFA) 220 MCG/ACT inhaler Inhale 1 puff into the lungs 2 (two) times daily.  12/24/17  Yes [provider]  levothyroxine (SYNTHROID, LEVOTHROID) 88 MCG tablet  Take 88 mcg by mouth daily. 02/18/18  Yes [provider]  omeprazole (PRILOSEC) 40 MG capsule Take 40 mg by mouth daily. 02/18/18  Yes [provider]  tiotropium (SPIRIVA HANDIHALER) 18 MCG inhalation capsule Place 18 mcg into inhaler and inhale daily. 10/14/17  Yes [provider]  acetaminophen (TYLENOL) 325 MG tablet Take 2 tablets (650 mg total) by mouth every 6 (six) hours as needed. 02/23/18   Marchia Bond, MD  enoxaparin (LOVENOX) 30 MG/0.3ML injection Inject 0.3 mLs (30 mg total) into the skin daily. Patient not taking: Reported on 04/14/2018 02/23/18   Marchia Bond, MD  HYDROcodone-acetaminophen (NORCO/VICODIN) 5-325 MG tablet Take 1 tablet by mouth  every 6 (six) hours as needed (for pain).    [provider]  sennosides-docusate sodium (SENOKOT-S) 8.6-50 MG tablet Take 2 tablets by mouth daily. Patient not taking: Reported on 04/14/2018 02/23/18   Marchia Bond, MD  traMADol (ULTRAM) 50 MG tablet Take 50 mg by mouth every 6 (six) hours as needed (for pain.).    [provider]     Positive ROS: All other systems have been reviewed and were otherwise negative with the exception of those mentioned in the HPI and as above.  Physical Exam: General: Alert, no acute distress Cardiovascular: No pedal edema Respiratory: No cyanosis, no use of accessory musculature GI: No organomegaly, abdomen is soft and non-tender Skin: No lesions in the area of chief complaint Neurologic: Sensation intact distally Psychiatric: Patient is competent for consent with normal mood and affect Lymphatic: No axillary or cervical lymphadenopathy  MUSCULOSKELETAL: Left upper extremity has well-healed surgical and traumatic wounds, skin is paper thin, clinical alignment looks reasonably good.  It is difficult to assess her hand function, but seems to have a flicker of hand motion and function in the fingers.  Assessment: Comminuted left open distal radius fracture status post external fixator   Plan: Plan for Procedure(s): REMOVAL EXTERNAL FIXATION ARM with application of short arm splint  The risks benefits and alternatives were discussed with the patient including but not limited to the risks of nonoperative treatment, versus surgical intervention including infection, bleeding, nerve injury,  blood clots, cardiopulmonary complications, morbidity, mortality, among others, and they were willing to proceed.   We also discussed the potential for posttraumatic arthritis, need for future revision surgery, the potential for infection, and the potential for the need for closure of the skin depending on their size after removal of the external  fixator.  Johnny Bridge, MD Cell [phone]   04/19/2018 10:23 AM

## 2018-04-19 NOTE — Discharge Instructions (Signed)
Diet: As you were doing prior to hospitalization   Shower:  May shower but keep the wounds dry, use an occlusive plastic wrap, NO SOAKING IN TUB.  If the bandage gets wet, change with a clean dry gauze.  If you have a splint on, leave the splint in place and keep the splint dry with a plastic bag.  Dressing:  You may change your dressing 3-5 days after surgery, unless you have a splint.  If you have a splint, then just leave the splint in place and we will change your bandages during your first follow-up appointment.    Activity:  Increase activity slowly as tolerated, but follow the weight bearing instructions below.  The rules on driving is that you can not be taking narcotics while you drive, and you must feel in control of the vehicle.    Weight Bearing:   No weight on left wrist.  OK to WB through elbow.    To prevent constipation: you may use a stool softener such as -  Colace (over the counter) 100 mg by mouth twice a day  Drink plenty of fluids (prune juice may be helpful) and high fiber foods Miralax (over the counter) for constipation as needed.    Itching:  If you experience itching with your medications, try taking only a single pain pill, or even half a pain pill at a time.  You may take up to 10 pain pills per day, and you can also use benadryl over the counter for itching or also to help with sleep.   Precautions:  If you experience chest pain or shortness of breath - call 911 immediately for transfer to the hospital emergency department!!  If you develop a fever greater that 101 F, purulent drainage from wound, increased redness or drainage from wound, or calf pain -- Call the office at [phone]                                                Follow- Up Appointment:  Please call for an appointment to be seen in 2 weeks [place] - [phone]

## 2018-04-20 ENCOUNTER — Encounter (HOSPITAL_COMMUNITY): Payer: Self-pay | Admitting: Orthopedic Surgery

## 2018-05-02 ENCOUNTER — Encounter: Payer: Self-pay | Admitting: Diagnostic Neuroimaging

## 2018-05-02 ENCOUNTER — Ambulatory Visit (INDEPENDENT_AMBULATORY_CARE_PROVIDER_SITE_OTHER): Payer: Medicare Other | Admitting: Diagnostic Neuroimaging

## 2018-05-02 VITALS — BP 118/78 | HR 84 | Ht 63.0 in | Wt 108.0 lb

## 2018-05-02 DIAGNOSIS — R569 Unspecified convulsions: Secondary | ICD-10-CM | POA: Diagnosis not present

## 2018-05-02 NOTE — Progress Notes (Signed)
[facility]  PATIENT: Rebecca Petty DOB: 09-28-1926  REFERRING CLINICIAN: R Grunz HISTORY FROM: patient and daughter x 2 REASON FOR VISIT: new consult    HISTORICAL  CHIEF COMPLAINT:  Chief Complaint  Patient presents with  . Seizures    rm 7, New Pt, dgtrs- Rebecca Petty, Dance movement psychotherapist, residing at [place], "seizure witnessed by daughter and nurse"    HISTORY OF PRESENT ILLNESS:   84 year old female here for evaluation of seizures.  April 2019 patient fell down at home, sustained left hip and left arm fracture, and also was knocked unconscious.  Patient had left frontal scalp hematoma. Patient was admitted to the hospital and underwent surgery for left arm fracture repair.  The next day patient had a generalized convulsive seizure in the hospital.  Patient was started on levetiracetam.  No further seizures.  No prior history of seizures.  Patient was transferred to inpatient rehabilitation and now skilled nursing facility.  At some point levetiracetam was discontinued, and patient is not currently taking this medication.  Patient has had gradual short-term memory loss and confusion over the last few years.  Patient does not been driving for the past 10 years.    REVIEW OF SYSTEMS: Full 14 system review of systems performed and negative with exception of: Memory loss confusion seizure anxiety easy bruising shortness of breath hearing loss.  ALLERGIES: No Known Allergies  HOME MEDICATIONS: Outpatient Medications Prior to Visit  Medication Sig Dispense Refill  . acetaminophen (TYLENOL) 325 MG tablet Take 2 tablets (650 mg total) by mouth every 6 (six) hours as needed. 60 tablet 1  . amLODipine (NORVASC) 5 MG tablet Take 5 mg by mouth daily.    . Cholecalciferol (VITAMIN D3) 2000 units TABS Take 2,000 Units by mouth daily.    . fluticasone (FLOVENT HFA) 220 MCG/ACT inhaler Inhale 1 puff into the lungs 2 (two) times daily.     Marland Kitchen HYDROcodone-acetaminophen  (NORCO/VICODIN) 5-325 MG tablet Take 1 tablet by mouth every 6 (six) hours as needed (for pain).    Marland Kitchen levothyroxine (SYNTHROID, LEVOTHROID) 88 MCG tablet Take 88 mcg by mouth daily.  1  . omeprazole (PRILOSEC) 40 MG capsule Take 40 mg by mouth daily.    Marland Kitchen tiotropium (SPIRIVA HANDIHALER) 18 MCG inhalation capsule Place 18 mcg into inhaler and inhale daily.    . traMADol (ULTRAM) 50 MG tablet Take 50 mg by mouth every 6 (six) hours as needed (for pain.).    Marland Kitchen enoxaparin (LOVENOX) 30 MG/0.3ML injection Inject 0.3 mLs (30 mg total) into the skin daily. (Patient not taking: Reported on 04/14/2018) 30 Syringe 0  . sennosides-docusate sodium (SENOKOT-S) 8.6-50 MG tablet Take 2 tablets by mouth daily. (Patient not taking: Reported on 04/14/2018) 30 tablet 1   No facility-administered medications prior to visit.     PAST MEDICAL HISTORY: Past Medical History:  Diagnosis Date  . Anxiety    panic attacks  . Arthritis    knee  . Cancer ([place])    skni foot  . Fracture of distal radius and ulna, left, open type III, initial encounter 02/23/2018  . Fracture of femoral neck, left ([place]) 02/23/2018  . GERD (gastroesophageal reflux disease)   . Hypertension   . Hypothyroidism   . Seizures ([place]) 02/2018   1st seizure, witnessed by daughter, Rebecca Petty   . Short-term memory loss   . Thyroid disease     PAST SURGICAL HISTORY: Past Surgical History:  Procedure Laterality Date  . COLONOSCOPY    .  EXTERNAL FIXATION ARM Left 02/23/2018   Procedure: EXTERNAL FIXATION Left ARM, irrigation and debridement bone, complex wound closure;  Surgeon: Marchia Bond, MD;  Location: [place];  Service: Orthopedics;  Laterality: Left;  . EXTERNAL FIXATION REMOVAL Left 04/19/2018   Procedure: REMOVAL EXTERNAL FIXATION ARM;  Surgeon: Marchia Bond, MD;  Location: [place];  Service: Orthopedics;  Laterality: Left;  . EYE SURGERY Bilateral    cataracts  . HIP PINNING,CANNULATED Left 02/23/2018   Procedure: CANNULATED LEFT HIP PINNING;   Surgeon: Marchia Bond, MD;  Location: [place];  Service: Orthopedics;  Laterality: Left;    FAMILY HISTORY: No family history on file.  SOCIAL HISTORY:  Social History   Socioeconomic History  . Marital status: Widowed    Spouse name: Not on file  . Number of children: 3  . Years of education: 51  . Highest education level: Not on file  Occupational History  . Not on file  Social Needs  . Financial resource strain: Not on file  . Food insecurity:    Worry: Not on file    Inability: Not on file  . Transportation needs:    Medical: Not on file    Non-medical: Not on file  Tobacco Use  . Smoking status: Former Smoker    Last attempt to quit: 05/02/1996    Years since quitting: 22.0  . Smokeless tobacco: Never Used  Substance and Sexual Activity  . Alcohol use: Not Currently  . Drug use: Never  . Sexual activity: Not on file  Lifestyle  . Physical activity:    Days per week: Not on file    Minutes per session: Not on file  . Stress: Not on file  Relationships  . Social connections:    Talks on phone: Not on file    Gets together: Not on file    Attends religious service: Not on file    Active member of club or organization: Not on file    Attends meetings of clubs or organizations: Not on file    Relationship status: Not on file  . Intimate partner violence:    Fear of current or ex partner: Not on file    Emotionally abused: Not on file    Physically abused: Not on file    Forced sexual activity: Not on file  Other Topics Concern  . Not on file  Social History Narrative   05/02/18 residing at [facility]   Caffeine- coffee 1 cup daily     PHYSICAL EXAM  GENERAL EXAM/CONSTITUTIONAL: Vitals:  Vitals:   05/02/18 1110  BP: 118/78  Pulse: 84  Weight: 108 lb (49 kg)  Height: 5\' 3"  (1.6 m)     Body mass index is 19.13 kg/m.  No exam data present  Patient is in no distress; well developed, nourished and groomed; neck is  supple  CARDIOVASCULAR:  Examination of carotid arteries is normal; no carotid bruits  Regular rate and rhythm, no murmurs  Examination of peripheral vascular system by observation and palpation is normal  EYES:  Ophthalmoscopic exam of optic discs and posterior segments is normal; no papilledema or hemorrhages  MUSCULOSKELETAL:  Gait, strength, tone, movements noted in Neurologic exam below  NEUROLOGIC: MENTAL STATUS:  No flowsheet data found.  awake, alert, oriented to person, place and time  recent and remote memory intact  normal attention and concentration  language fluent, comprehension intact, naming intact,   fund of knowledge appropriate  CRANIAL NERVE:   2nd - no papilledema on  fundoscopic exam  2nd, 3rd, 4th, 6th - pupils equal and reactive to light, visual fields full to confrontation, extraocular muscles intact, no nystagmus  5th - facial sensation symmetric  7th - facial strength symmetric  8th - hearing intact  9th - palate elevates symmetrically, uvula midline  11th - shoulder shrug symmetric  12th - tongue protrusion midline  MOTOR:   normal bulk and tone, full strength in the RUE, RLE; LUE (IN FOREARM BRACE); LLE (LEFT HIP FLEXION 3; LEFT DF 3-4)  SENSORY:   normal and symmetric to light touch, DECR IN FEET  COORDINATION:   finger-nose-finger, fine finger movements SLOW  REFLEXES:   deep tendon reflexes TRACE and symmetric  GAIT/STATION:   IN WHEEL CHAIR    DIAGNOSTIC DATA (LABS, IMAGING, TESTING) - I reviewed patient records, labs, notes, testing and imaging myself where available.  Lab Results  Component Value Date   WBC 8.6 04/19/2018   HGB 12.7 04/19/2018   HCT 38.4 04/19/2018   MCV 89.3 04/19/2018   PLT 295 04/19/2018      Component Value Date/Time   NA 140 04/19/2018 0938   K 2.9 (L) 04/19/2018 0938   CL 101 04/19/2018 0938   CO2 29 04/19/2018 0938   GLUCOSE 120 (H) 04/19/2018 0938   BUN 10 04/19/2018  0938   CREATININE 0.54 04/19/2018 0938   CALCIUM 9.1 04/19/2018 0938   PROT 4.9 (L) 02/24/2018 0646   ALBUMIN 2.9 (L) 02/24/2018 0646   AST 26 02/24/2018 0646   ALT 13 (L) 02/24/2018 0646   ALKPHOS 67 02/24/2018 0646   BILITOT 0.8 02/24/2018 0646   GFRNONAA >60 04/19/2018 0938   GFRAA >60 04/19/2018 0938   No results found for: CHOL, HDL, LDLCALC, LDLDIRECT, TRIG, CHOLHDL No results found for: HGBA1C No results found for: VITAMINB12 No results found for: TSH   02/25/18 EEG  - normal  02/24/18 CT head [I reviewed images myself and agree with interpretation. -VRP]  1. No acute intracranial process. Small residual LEFT frontal scalp hematoma. No skull fracture. Stable examination including severe chronic small vessel ischemic disease.     ASSESSMENT AND PLAN  84 y.o. year old female here with new onset seizure on 02/25/2018, in the setting of head trauma the day before, as well as possible underlying neurodegenerative condition.  Patient was started on antiseizure medication in the hospital but at some point this was discontinued.  I recommended to restart medication but patient declined.   Dx: new onset seizure (02/25/18) --> risk factors (head trauma, neurodegenerative)  1. New onset seizure [facility])      PLAN:  - offered to restart anti-seizure medication (levetiracetam) but patient adamently declines any new meds - if patient has second seizure, then will start levetiracetam   Return if symptoms worsen or fail to improve, for as needed.  I reviewed images, labs, notes, records myself. I summarized findings and reviewed with patient, for this high risk condition (seizure) requiring high complexity decision making.     Penni Bombard, MD 0/35/4656, 81:27 AM Certified in Neurology, Neurophysiology and Neuroimaging  [facility] [address], [place], [place] [address] [phone]

## 2018-05-02 NOTE — Patient Instructions (Signed)
-   monitor symptoms

## 2018-06-20 ENCOUNTER — Emergency Department (HOSPITAL_COMMUNITY)
Admission: EM | Admit: 2018-06-20 | Discharge: 2018-06-20 | Disposition: A | Discharge status: Home / Self Care | Payer: Medicare Other | Attending: Emergency Medicine | Admitting: Emergency Medicine

## 2018-06-20 ENCOUNTER — Other Ambulatory Visit: Payer: Self-pay

## 2018-06-20 ENCOUNTER — Emergency Department (HOSPITAL_COMMUNITY): Payer: Medicare Other

## 2018-06-20 ENCOUNTER — Encounter (HOSPITAL_COMMUNITY): Payer: Self-pay | Admitting: Emergency Medicine

## 2018-06-20 DIAGNOSIS — M6281 Muscle weakness (generalized): Secondary | ICD-10-CM | POA: Diagnosis present

## 2018-06-20 DIAGNOSIS — Z87891 Personal history of nicotine dependence: Secondary | ICD-10-CM | POA: Insufficient documentation

## 2018-06-20 DIAGNOSIS — E039 Hypothyroidism, unspecified: Secondary | ICD-10-CM | POA: Diagnosis not present

## 2018-06-20 DIAGNOSIS — M21372 Foot drop, left foot: Secondary | ICD-10-CM | POA: Diagnosis not present

## 2018-06-20 DIAGNOSIS — Z79899 Other long term (current) drug therapy: Secondary | ICD-10-CM | POA: Insufficient documentation

## 2018-06-20 DIAGNOSIS — Z85828 Personal history of other malignant neoplasm of skin: Secondary | ICD-10-CM | POA: Insufficient documentation

## 2018-06-20 DIAGNOSIS — I10 Essential (primary) hypertension: Secondary | ICD-10-CM | POA: Diagnosis not present

## 2018-06-20 DIAGNOSIS — R2689 Other abnormalities of gait and mobility: Secondary | ICD-10-CM | POA: Diagnosis not present

## 2018-06-20 LAB — COMPREHENSIVE METABOLIC PANEL
ALK PHOS: 102 U/L (ref 38–126)
ALT: 13 U/L (ref 0–44)
ANION GAP: 13 (ref 5–15)
AST: 18 U/L (ref 15–41)
Albumin: 3.6 g/dL (ref 3.5–5.0)
BUN: 28 mg/dL — ABNORMAL HIGH (ref 8–23)
CALCIUM: 9.2 mg/dL (ref 8.9–10.3)
CO2: 25 mmol/L (ref 22–32)
Chloride: 104 mmol/L (ref 98–111)
Creatinine, Ser: 1.17 mg/dL — ABNORMAL HIGH (ref 0.44–1.00)
GFR calc non Af Amer: 40 mL/min — ABNORMAL LOW (ref >60)
GFR, EST AFRICAN AMERICAN: 46 mL/min — AB (ref >60)
Glucose, Bld: 145 mg/dL — ABNORMAL HIGH (ref 70–99)
Potassium: 3.6 mmol/L (ref 3.5–5.1)
SODIUM: 142 mmol/L (ref 135–145)
Total Bilirubin: 0.7 mg/dL (ref 0.3–1.2)
Total Protein: 6.6 g/dL (ref 6.5–8.1)

## 2018-06-20 LAB — CBC WITH DIFFERENTIAL/PLATELET
Abs Immature Granulocytes: 0 10*3/uL (ref 0.0–0.1)
BASOS PCT: 1 %
Basophils Absolute: 0.1 10*3/uL (ref 0.0–0.1)
Eosinophils Absolute: 0.1 10*3/uL (ref 0.0–0.7)
Eosinophils Relative: 1 %
HCT: 42.4 % (ref 36.0–46.0)
Hemoglobin: 13.8 g/dL (ref 12.0–15.0)
IMMATURE GRANULOCYTES: 1 %
Lymphocytes Relative: 26 %
Lymphs Abs: 2 10*3/uL (ref 0.7–4.0)
MCH: 29.7 pg (ref 26.0–34.0)
MCHC: 32.5 g/dL (ref 30.0–36.0)
MCV: 91.2 fL (ref 78.0–100.0)
MONOS PCT: 8 %
Monocytes Absolute: 0.6 10*3/uL (ref 0.1–1.0)
NEUTROS PCT: 63 %
Neutro Abs: 4.9 10*3/uL (ref 1.7–7.7)
PLATELETS: 288 10*3/uL (ref 150–400)
RBC: 4.65 MIL/uL (ref 3.87–5.11)
RDW: 14.5 % (ref 11.5–15.5)
WBC: 7.7 10*3/uL (ref 4.0–10.5)

## 2018-06-20 LAB — URINALYSIS, ROUTINE W REFLEX MICROSCOPIC
Bilirubin Urine: NEGATIVE
Glucose, UA: NEGATIVE mg/dL
Hgb urine dipstick: NEGATIVE
KETONES UR: NEGATIVE mg/dL
LEUKOCYTES UA: NEGATIVE
NITRITE: NEGATIVE
PROTEIN: NEGATIVE mg/dL
Specific Gravity, Urine: 1.014 (ref 1.005–1.030)
pH: 6 (ref 5.0–8.0)

## 2018-06-20 NOTE — ED Notes (Signed)
Urine culture tube sent down with urine sample.   

## 2018-06-20 NOTE — ED Provider Notes (Signed)
MSE was initiated and I personally evaluated the patient and placed orders (if any) at  5:36 PM on June 20, 2018.  The patient appears stable so that the remainder of the MSE may be completed by another provider.  Patient placed in Quick Look pathway, seen and evaluated   Chief Complaint: Left foot weakness  HPI:   Patient is a 84 year old female with a history of hypertension but this is not on antihypertensives), left total hip arthroplasty in April 2019, hypothyroidism presenting for left foot drop.  Patient reports that it has been like this for a week.  Patient was at home occupational therapy today and the occupational therapist recommended transfer to the emergency department for stroke work-up.  Patient reports she has had chronic left hand weakness due to a wrist fracture, and has not increased over the past week.  No difficulty speaking, difficulty swallowing.  Patient had a compression fracture of lumbar spine followed by PCP, but has no current back pain.  No history of cancer, fever or chills, no saddle anesthesia, loss of bowel or bladder control.   ROS: See HPI (one)  Physical Exam:   Gen: No distress  Neuro: Awake and Alert  Skin: Warm    Focused Exam:   Mental Status:  Alert, oriented, thought content appropriate, able to give a coherent history. Speech fluent without evidence of aphasia. Able to follow 2 step commands without difficulty.  Cranial Nerves:  II:  Peripheral visual fields grossly normal, pupils equal, round, reactive to light III,IV, VI: ptosis not present, extra-ocular motions intact bilaterally  V,VII: smile symmetric, facial light touch sensation equal VIII: hearing grossly normal to voice  X: uvula elevates symmetrically  XI: bilateral shoulder shrug symmetric and strong XII: midline tongue extension without fassiculations Motor:  Patient has 4 out of 5 grip strength on left and 5 out of 5 grip strength on right.  Hip flexion extension, and knee  flexion and extension 5 out of 5 bilaterally.  Patient has absent dorsiflexion, but can plantar flex on left.  5/5 dorsi and plantarflexion on right.  Patient has new hammer toe of left 2nd digit on left.  Sensory: Patient reporting slightly decreased sensation in toes 3-5 on left.  Stance: No pronator drift and good coordination, strength, and position sense with tapping of bilateral arms (performed in sitting position). CV: distal pulses palpable throughout   Spine: No midline tenderness of cervical, thoracic, or lumbar spine.  Patient is not meeting criteria for code stroke.  Will initiate work-up with head CT, labs.  Possible peripheral nerve deficit.  Case discussed with Dr. Pattricia Boss.  Initiation of care has begun. The patient has been counseled on the process, plan, and necessity for staying for the completion/evaluation, and the remainder of the medical screening examination    Tamala Julian 06/20/18 1751    Pattricia Boss, MD 06/23/18 1655

## 2018-06-20 NOTE — ED Notes (Signed)
Pt given d/c instructions, family verbalized understanding. Pt taken to lobby via wheelchair. No questions or concerns verbalized at time of d/c.

## 2018-06-20 NOTE — ED Notes (Signed)
Patient transported to CT 

## 2018-06-20 NOTE — ED Triage Notes (Signed)
Pt has been having OT for recent hip and wrist fx. Pt has been using assistance to walk since. OT noticed a "foot drop" last week that they thought was from her shingles. Today they reported it has not improved. Pt reports some nausea. Pt had injury from fall to left hand with decreased grip strength. Weakness noted to left foot. No other neuro deficits. A&Ox4.

## 2018-06-20 NOTE — ED Provider Notes (Signed)
[facility] EMERGENCY DEPARTMENT Provider Note   CSN: [id number] Arrival date & time: 06/20/18  1627     History   Chief Complaint Chief Complaint  Patient presents with  . Weakness    left foot    HPI Rebecca Petty is a 84 y.o. female.  HPI Presents with left foot drop and left foot numbness for the past week.  In April of this year she fell and had a open fracture of her left wrist as well as a left hip fracture.  Treated with internal fixation.  Is been going to physical therapy and noted to have left-sided foot drop.  Felt that this may be related to recent shingles outbreak on the left side. Past Medical History:  Diagnosis Date  . Anxiety    panic attacks  . Arthritis    knee  . Cancer ([place])    skni foot  . Fracture of distal radius and ulna, left, open type III, initial encounter 02/23/2018  . Fracture of femoral neck, left ([place]) 02/23/2018  . GERD (gastroesophageal reflux disease)   . Hypertension   . Hypothyroidism   . Seizures ([place]) 02/2018   1st seizure, witnessed by daughter, Hilda Blades   . Short-term memory loss   . Thyroid disease     Patient Active Problem List   Diagnosis Date Noted  . Fracture of distal radius and ulna, left, open type III, with routine healing, subsequent encounter 02/23/2018  . Fracture of femoral neck, left ([place]) 02/23/2018  . Hypokalemia 02/23/2018  . Femoral fracture ([place]) 02/23/2018    Past Surgical History:  Procedure Laterality Date  . COLONOSCOPY    . EXTERNAL FIXATION ARM Left 02/23/2018   Procedure: EXTERNAL FIXATION Left ARM, irrigation and debridement bone, complex wound closure;  Surgeon: Marchia Bond, MD;  Location: [facility];  Service: Orthopedics;  Laterality: Left;  . EXTERNAL FIXATION REMOVAL Left 04/19/2018   Procedure: REMOVAL EXTERNAL FIXATION ARM;  Surgeon: Marchia Bond, MD;  Location: [facility];  Service: Orthopedics;  Laterality: Left;  . EYE SURGERY Bilateral    cataracts  . HIP  PINNING,CANNULATED Left 02/23/2018   Procedure: CANNULATED LEFT HIP PINNING;  Surgeon: Marchia Bond, MD;  Location: [facility];  Service: Orthopedics;  Laterality: Left;     OB History   None      Home Medications    Prior to Admission medications   Medication Sig Start Date End Date Taking? Authorizing Provider  acetaminophen (TYLENOL) 325 MG tablet Take 2 tablets (650 mg total) by mouth every 6 (six) hours as needed. 02/23/18   Marchia Bond, MD  amLODipine (NORVASC) 5 MG tablet Take 5 mg by mouth daily. 02/23/18   [provider]  Cholecalciferol (VITAMIN D3) 2000 units TABS Take 2,000 Units by mouth daily.    [provider]  enoxaparin (LOVENOX) 30 MG/0.3ML injection Inject 0.3 mLs (30 mg total) into the skin daily. Patient not taking: Reported on 04/14/2018 02/23/18   Marchia Bond, MD  fluticasone (FLOVENT HFA) 220 MCG/ACT inhaler Inhale 1 puff into the lungs 2 (two) times daily.  12/24/17   [provider]  HYDROcodone-acetaminophen (NORCO/VICODIN) 5-325 MG tablet Take 1 tablet by mouth every 6 (six) hours as needed (for pain).    [provider]  levothyroxine (SYNTHROID, LEVOTHROID) 88 MCG tablet Take 88 mcg by mouth daily. 02/18/18   [provider]  omeprazole (PRILOSEC) 40 MG capsule Take 40 mg by mouth daily. 02/18/18   [provider]  sennosides-docusate sodium (SENOKOT-S) 8.6-50 MG tablet Take 2 tablets by mouth daily. Patient not taking: Reported on 04/14/2018 02/23/18   Marchia Bond, MD  tiotropium (SPIRIVA HANDIHALER) 18 MCG inhalation capsule Place 18 mcg into inhaler and inhale daily. 10/14/17   [provider]  traMADol (ULTRAM) 50 MG tablet Take 50 mg by mouth every 6 (six) hours as needed (for pain.).    [provider]    Family History No family history on file.  Social History Social History   Tobacco Use  . Smoking status: Former Smoker    Last attempt to quit: 05/02/1996    Years since  quitting: 22.1  . Smokeless tobacco: Never Used  Substance Use Topics  . Alcohol use: Not Currently  . Drug use: Never     Allergies   Patient has no known allergies.   Review of Systems Review of Systems  Constitutional: Negative for chills and fever.  Eyes: Negative for visual disturbance.  Respiratory: Negative for cough and shortness of breath.   Cardiovascular: Negative for chest pain.  Gastrointestinal: Negative for abdominal pain, constipation, diarrhea, nausea and vomiting.  Genitourinary: Negative for difficulty urinating, dysuria, enuresis, flank pain and frequency.  Musculoskeletal: Positive for gait problem. Negative for back pain, myalgias and neck pain.  Skin: Positive for rash. Negative for wound.  Neurological: Positive for weakness and numbness. Negative for light-headedness and headaches.  All other systems reviewed and are negative.    Physical Exam Updated Vital Signs BP (!) 155/74   Pulse 71   Temp 98 F (36.7 C) (Oral)   Resp 16   Ht 5\' 3"  (1.6 m)   Wt 46.7 kg   SpO2 100%   BMI 18.25 kg/m   Physical Exam  Constitutional: She is oriented to person, place, and time. She appears well-developed and well-nourished. No distress.  HENT:  Head: Normocephalic and atraumatic.  Mouth/Throat: Oropharynx is clear and moist. No oropharyngeal exudate.  Eyes: Pupils are equal, round, and reactive to light. EOM are normal.  Neck: Normal range of motion. Neck supple.  No posterior midline cervical tenderness to palpation.  Cardiovascular: Normal rate and regular rhythm. Exam reveals no gallop and no friction rub.  No murmur heard. Pulmonary/Chest: Effort normal and breath sounds normal. No respiratory distress. She has no wheezes. She has no rales.  Abdominal: Soft. Bowel sounds are normal. She exhibits no distension and no mass. There is no tenderness. There is no rebound and no guarding. No hernia.  Musculoskeletal: Normal range of motion. She exhibits no  edema or tenderness.  No midline thoracic or lumbar tenderness.  Patient does have some tenderness to palpation over the left lumbar region on the distribution of previous shingles outbreak.  Full range of motion of the left hip without discomfort or pain.  No lower extremity swelling, asymmetry or tenderness.  Neurological: She is alert and oriented to person, place, and time.  0/5 left foot dorsiflexion.  5/5 left foot plantarflexion, knee extension/flexion, hip extension/flexion.  Diminished sensation over the left foot compared to right.  Skin: Skin is warm and dry. Capillary refill takes less than 2 seconds. Rash noted. She is not diaphoretic. No erythema.  Psychiatric: She has a normal mood and affect. Her behavior is normal.  Nursing note and vitals reviewed.    ED Treatments / Results  Labs (all labs ordered are listed, but only abnormal results are displayed) Labs Reviewed  COMPREHENSIVE METABOLIC PANEL - Abnormal; Notable for the following components:  Result Value   Glucose, Bld 145 (*)    BUN 28 (*)    Creatinine, Ser 1.17 (*)    GFR calc non Af Amer 40 (*)    GFR calc Af Amer 46 (*)    All other components within normal limits  CBC WITH DIFFERENTIAL/PLATELET  URINALYSIS, ROUTINE W REFLEX MICROSCOPIC    EKG None  Radiology Ct Head Wo Contrast  Result Date: 06/20/2018 CLINICAL DATA:  Foot drop on the left EXAM: CT HEAD WITHOUT CONTRAST TECHNIQUE: Contiguous axial images were obtained from the base of the skull through the vertex without intravenous contrast. COMPARISON:  02/24/2018 FINDINGS: Brain: Chronic atrophic and ischemic changes are again seen similar to that noted on the prior exam. No findings to suggest acute hemorrhage, acute infarction or space-occupying mass lesion are seen. Vascular: No hyperdense vessel or unexpected calcification. Skull: Normal. Negative for fracture or focal lesion. Sinuses/Orbits: No acute finding. Bilateral cataract surgeries are  noted. Other: None. IMPRESSION: Chronic atrophic and ischemic changes similar to that seen on the prior exam. Electronically Signed   By: Inez Catalina M.D.   On: 06/20/2018 20:34    Procedures Procedures (including critical care time)  Medications Ordered in ED Medications - No data to display   Initial Impression / Assessment and Plan / ED Course  I have reviewed the triage vital signs and the nursing notes.  Pertinent labs & imaging results that were available during my care of the patient were reviewed by me and considered in my medical decision making (see chart for details).     CT head without acute findings.  Neurologic deficit is restricted just to the left foot.  Suspect peripheral neuropathy as the cause.  Low suspicion for stroke. Discussed with neurology.  Advised outpatient neurology follow-up. Final Clinical Impressions(s) / ED Diagnoses   Final diagnoses:  Left foot drop    ED Discharge Orders    None       Julianne Rice, MD 06/20/18 2121

## 2018-07-25 ENCOUNTER — Ambulatory Visit: Payer: Medicare Other | Admitting: Diagnostic Neuroimaging

## 2018-07-25 ENCOUNTER — Encounter

## 2019-02-18 DIAGNOSIS — R55 Syncope and collapse: Secondary | ICD-10-CM | POA: Diagnosis not present

## 2019-02-18 DIAGNOSIS — I10 Essential (primary) hypertension: Secondary | ICD-10-CM

## 2019-02-18 DIAGNOSIS — I160 Hypertensive urgency: Secondary | ICD-10-CM

## 2019-02-19 DIAGNOSIS — I10 Essential (primary) hypertension: Secondary | ICD-10-CM | POA: Diagnosis not present

## 2019-02-19 DIAGNOSIS — R55 Syncope and collapse: Secondary | ICD-10-CM | POA: Diagnosis not present

## 2019-02-19 DIAGNOSIS — I160 Hypertensive urgency: Secondary | ICD-10-CM | POA: Diagnosis not present

## 2019-04-24 IMAGING — CT CT HEAD W/O CM
3 of 4 series · 14 of 47 positions shown, 16 images · non-contrast
Comparison: CT HEAD February 23, 2018

CLINICAL DATA: New onset seizure.  History of hypertension.

EXAM:
CT HEAD WITHOUT CONTRAST
TECHNIQUE: Contiguous axial images were obtained from the base of the skull
through the vertex without intravenous contrast.

[Series 4: head 2.0 h70h · axial · 0.40mm/px · z∈[+1204,+1348]mm · 8 of 90 slices shown, 10 images]
[im 9/90  brain]
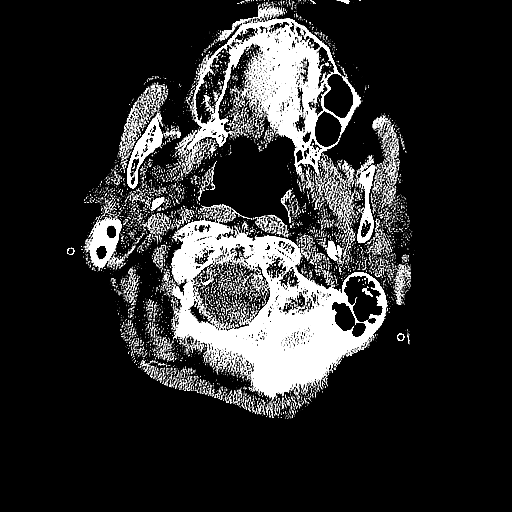
[im 9/90  bone]
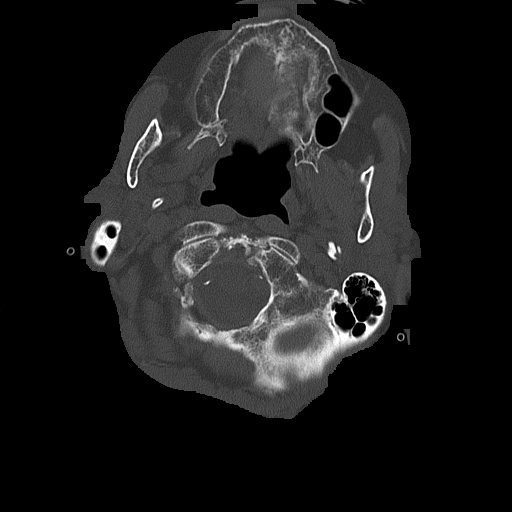
[im 18/90  brain]
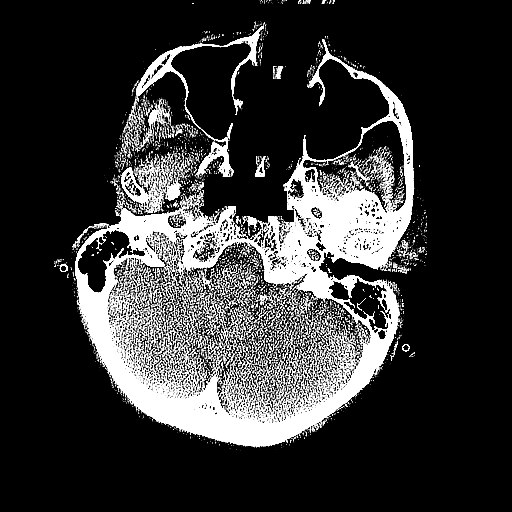
[im 27/90  brain]
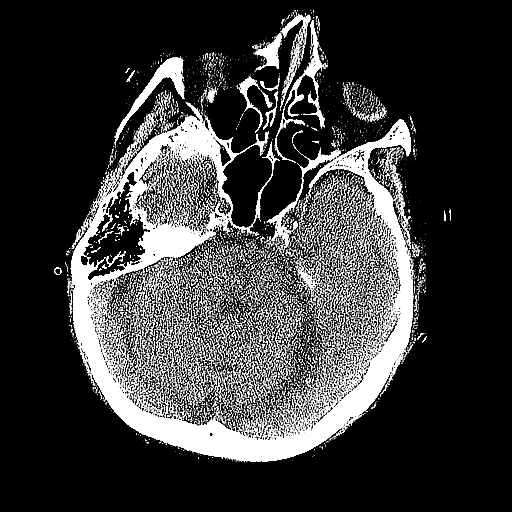
[im 41/90  brain]
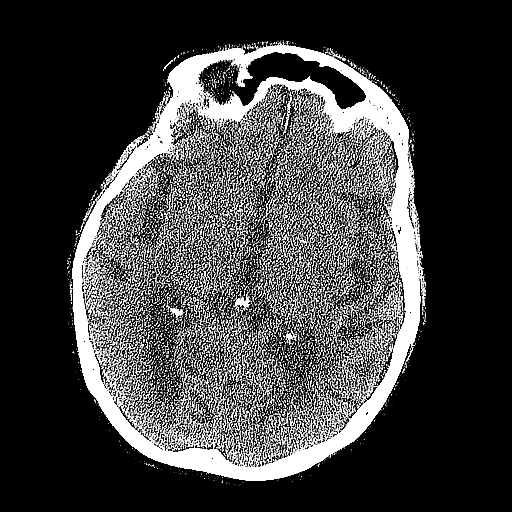
[im 49/90  brain]
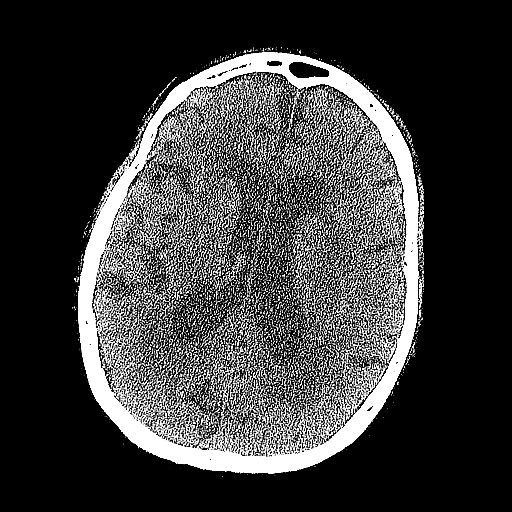
[im 49/90  bone]
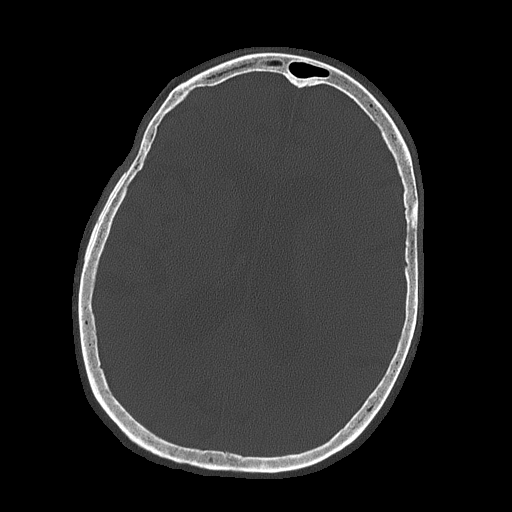
[im 63/90  brain]
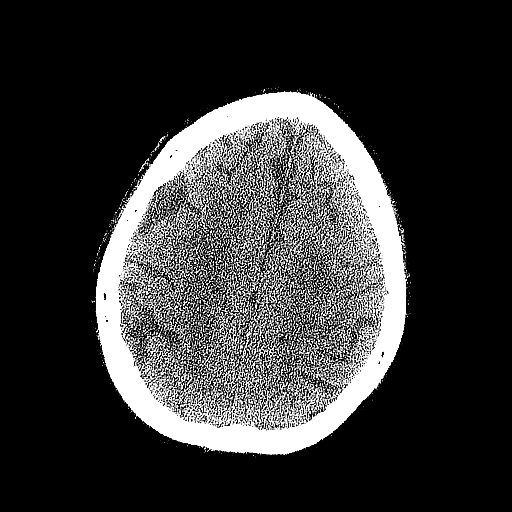
[im 72/90  brain]
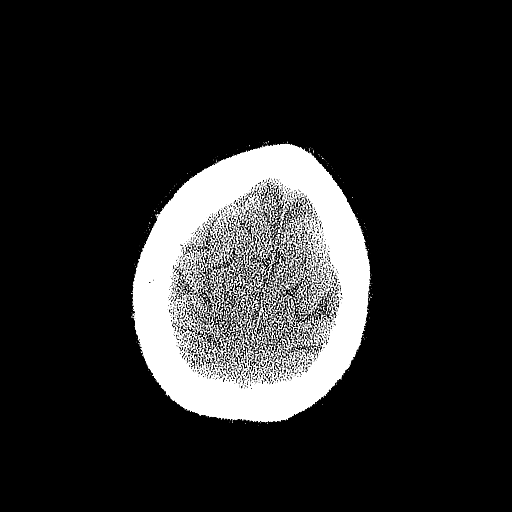
[im 81/90  brain]
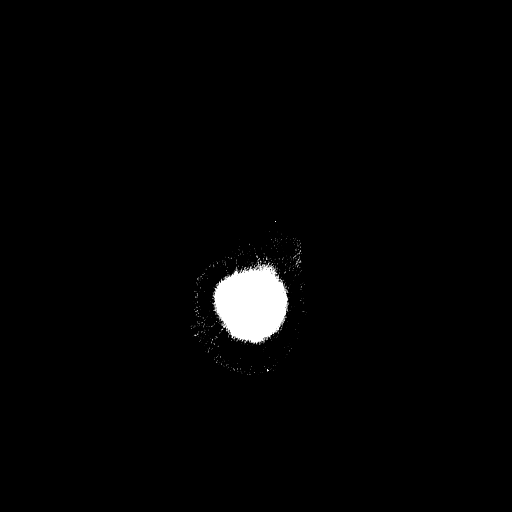

[Series 5: head 3.0 mpr cor · coronal · 0.32mm/px · 3 of 67 slices shown]
[im 23/67  brain]
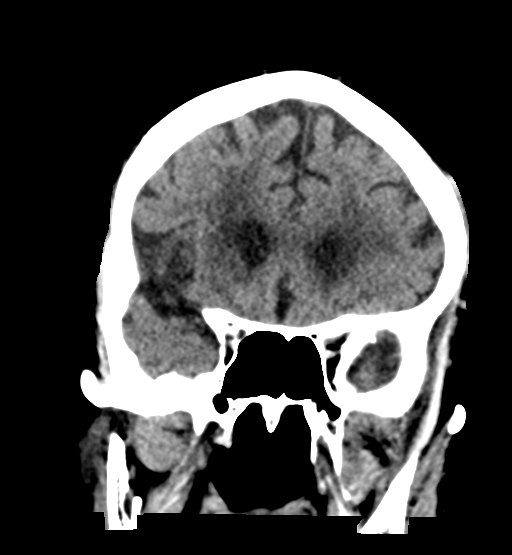
[im 30/67  brain]
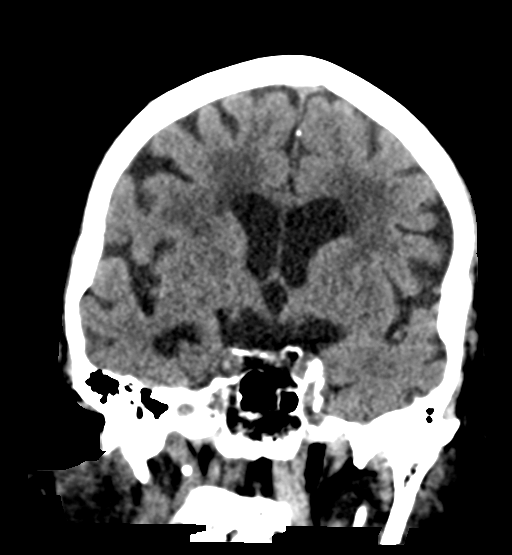
[im 37/67  brain]
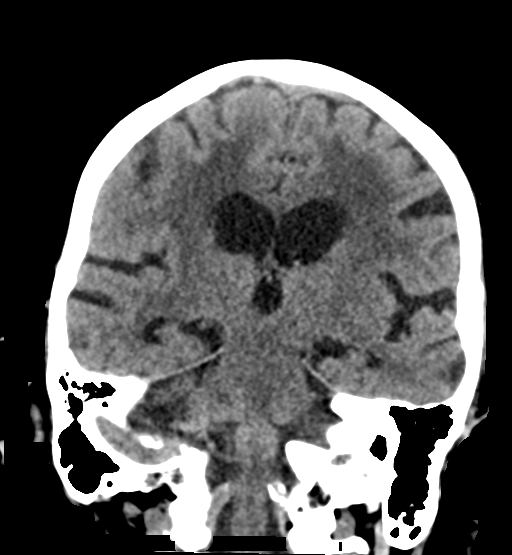

[Series 6: head 3.0 mpr sag · sagittal · 0.35mm/px · 3 of 67 slices shown]
[im 23/67  brain]
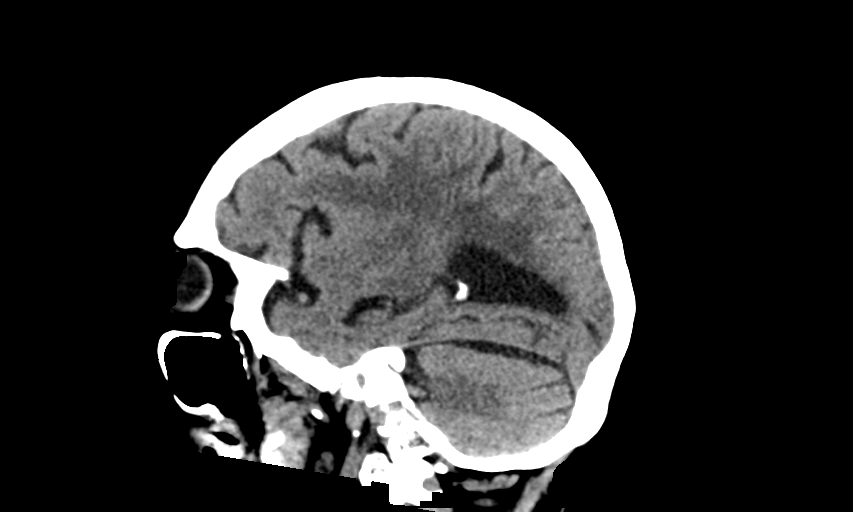
[im 34/67  brain]
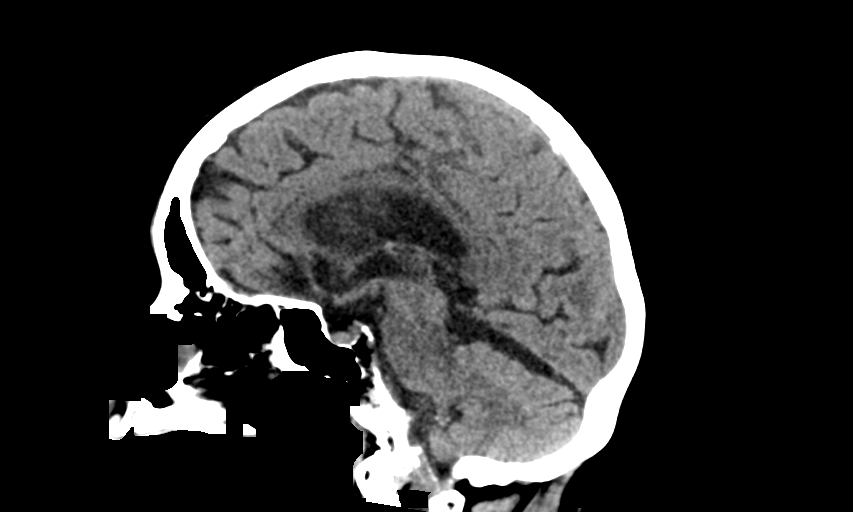
[im 45/67  brain]
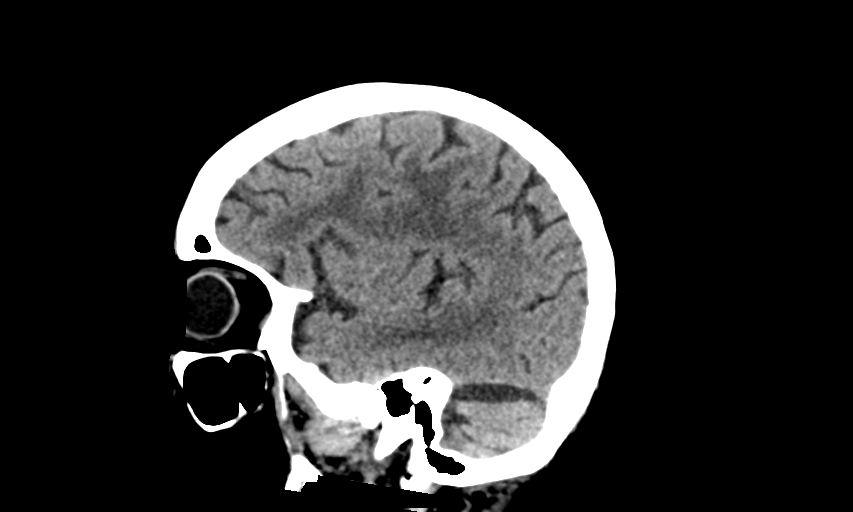

[14 of 47 positions shown; findings below may reference images not displayed]

FINDINGS: BRAIN: No intraparenchymal hemorrhage, mass effect nor midline
shift. The ventricles and sulci are normal for age. Confluent
supratentorial white matter hypodensities. Patchy bilateral basal
ganglia hypodensities. No acute large vascular territory infarcts.
No abnormal extra-axial fluid collections. Basal cisterns are
patent.

VASCULAR: Moderate calcific atherosclerosis of the carotid siphons.

SKULL: No skull fracture. Small LEFT frontal scalp hematoma,
decreased from yesterday.

SINUSES/ORBITS: Trace paranasal sinus mucosal thickening without
air-fluid levels. Mastoid air cells are well aerated.The included
ocular globes and orbital contents are non-suspicious. Status post
bilateral ocular lens implants.

OTHER: Patient is edentulous.
IMPRESSION: 1. No acute intracranial process. Small residual LEFT frontal scalp
hematoma. No skull fracture. Stable examination including severe
chronic small vessel ischemic disease.

## 2020-04-09 DEATH — deceased
